# Patient Record
Sex: Female | Born: 1995 | Race: White | Hispanic: No | Smoking: Never smoker
Health system: Southern US, Community
[De-identification: ages and names within clinical notes are randomized; demographics above are authoritative.]

## PROBLEM LIST (undated history)

## (undated) VITALS — BP 136/91 | HR 107 | Temp 98.7°F | Resp 18 | Ht 62.0 in | Wt 283.0 lb

## (undated) DIAGNOSIS — R519 Headache, unspecified: Secondary | ICD-10-CM

## (undated) DIAGNOSIS — N809 Endometriosis, unspecified: Secondary | ICD-10-CM

## (undated) HISTORY — DX: Headache, unspecified: R51.9

## (undated) HISTORY — PX: OTHER SURGICAL HISTORY: SHX169

---

## 2019-05-20 ENCOUNTER — Encounter (HOSPITAL_COMMUNITY): Payer: Self-pay | Admitting: Emergency Medicine

## 2019-05-20 ENCOUNTER — Emergency Department (HOSPITAL_COMMUNITY): Payer: BC Managed Care – PPO

## 2019-05-20 ENCOUNTER — Other Ambulatory Visit: Payer: Self-pay

## 2019-05-20 ENCOUNTER — Emergency Department (HOSPITAL_COMMUNITY)
Admission: EM | Admit: 2019-05-20 | Discharge: 2019-05-20 | Disposition: A | Discharge status: Home / Self Care | Payer: BC Managed Care – PPO | Attending: Emergency Medicine | Admitting: Emergency Medicine

## 2019-05-20 DIAGNOSIS — R102 Pelvic and perineal pain: Secondary | ICD-10-CM | POA: Diagnosis present

## 2019-05-20 DIAGNOSIS — R52 Pain, unspecified: Secondary | ICD-10-CM

## 2019-05-20 HISTORY — DX: Endometriosis, unspecified: N80.9

## 2019-05-20 LAB — BASIC METABOLIC PANEL
Anion gap: 9 (ref 5–15)
BUN: 12 mg/dL (ref 6–20)
CO2: 21 mmol/L — ABNORMAL LOW (ref 22–32)
Calcium: 9.3 mg/dL (ref 8.9–10.3)
Chloride: 109 mmol/L (ref 98–111)
Creatinine, Ser: 0.58 mg/dL (ref 0.44–1.00)
GFR calc Af Amer: >60 mL/min (ref >60)
GFR calc non Af Amer: >60 mL/min (ref >60)
Glucose, Bld: 92 mg/dL (ref 70–99)
Potassium: 3.6 mmol/L (ref 3.5–5.1)
Sodium: 139 mmol/L (ref 135–145)

## 2019-05-20 LAB — URINALYSIS, ROUTINE W REFLEX MICROSCOPIC
Bilirubin Urine: NEGATIVE
Glucose, UA: NEGATIVE mg/dL
Hgb urine dipstick: NEGATIVE
Ketones, ur: NEGATIVE mg/dL
Nitrite: NEGATIVE
Protein, ur: NEGATIVE mg/dL
Specific Gravity, Urine: 1.018 (ref 1.005–1.030)
pH: 6 (ref 5.0–8.0)

## 2019-05-20 LAB — CBC WITH DIFFERENTIAL/PLATELET
Abs Immature Granulocytes: 0.04 10*3/uL (ref 0.00–0.07)
Basophils Absolute: 0 10*3/uL (ref 0.0–0.1)
Basophils Relative: 0 %
Eosinophils Absolute: 0.2 10*3/uL (ref 0.0–0.5)
Eosinophils Relative: 2 %
HCT: 39.5 % (ref 36.0–46.0)
Hemoglobin: 12.4 g/dL (ref 12.0–15.0)
Immature Granulocytes: 0 %
Lymphocytes Relative: 24 %
Lymphs Abs: 2.3 10*3/uL (ref 0.7–4.0)
MCH: 26.6 pg (ref 26.0–34.0)
MCHC: 31.4 g/dL (ref 30.0–36.0)
MCV: 84.6 fL (ref 80.0–100.0)
Monocytes Absolute: 0.6 10*3/uL (ref 0.1–1.0)
Monocytes Relative: 6 %
Neutro Abs: 6.6 10*3/uL (ref 1.7–7.7)
Neutrophils Relative %: 68 %
Platelets: 345 10*3/uL (ref 150–400)
RBC: 4.67 MIL/uL (ref 3.87–5.11)
RDW: 13.6 % (ref 11.5–15.5)
WBC: 9.8 10*3/uL (ref 4.0–10.5)
nRBC: 0 % (ref 0.0–0.2)

## 2019-05-20 LAB — POC URINE PREG, ED: Preg Test, Ur: NEGATIVE

## 2019-05-20 MED ORDER — CEPHALEXIN 500 MG PO CAPS: 500.0000 mg | ORAL_CAPSULE | Freq: Four times a day (QID) | ORAL | 0 refills | Status: DC

## 2019-05-20 MED ORDER — PHENAZOPYRIDINE HCL 200 MG PO TABS: 200.0000 mg | ORAL_TABLET | Freq: Three times a day (TID) | ORAL | 0 refills | Status: DC

## 2019-05-20 NOTE — Discharge Instructions (Signed)
Please take your medications, as prescribed.  Please follow-up with your OB/GYN/fertility provider in [place] for ongoing evaluation and management of your endometriosis and frequent ovarian cysts.   Return to the ED or seek medical attention for any new or worsening symptoms.

## 2019-05-20 NOTE — ED Notes (Signed)
Patient transported to Ultrasound 

## 2019-05-20 NOTE — ED Provider Notes (Signed)
[facility] EMERGENCY DEPARTMENT Provider Note   CSN: [id number] Arrival date & time: 05/20/19  1740     History Chief Complaint  Patient presents with  . Pelvic Pain    Tiffany Weber is a 23 y.o. female with PMH significant for endometriosis and ovarian cysts who presents to the ED with a 2-hour history of sharp, 8 out of 10 suprapubic and lower left abdominal pain.  She reports that this pain is worse with movement.  She is sexually active, but is adamant that she is not pregnant.  She denies any recent illness, fevers, chills, upper abdominal discomfort, nausea or vomiting, vaginal bleeding, vaginal discharge, constipation or hematochezia, dysuria, or increased urinary frequency.  She reports that she had a right-sided salpingo-oophorectomy resulting from her endometriosis.  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  HPI     Past Medical History:  Diagnosis Date  . Endometriosis     There are no problems to display for this patient.   Past Surgical History:  Procedure Laterality Date  . oophrectomy Right      OB History    Gravida      Para      Term      Preterm      AB      Living  0     SAB      TAB      Ectopic      Multiple      Live Births              History reviewed. No pertinent family history.  Social History   Tobacco Use  . Smoking status: Never Smoker  . Smokeless tobacco: Never Used  Substance Use Topics  . Alcohol use: Never  . Drug use: Never    Home Medications Prior to Admission medications   Medication Sig Start Date End Date Taking? Authorizing Provider  FLUoxetine (PROZAC) 40 MG capsule Take 40 mg by mouth every evening.   Yes [provider]  cephALEXin (KEFLEX) 500 MG capsule Take 1 capsule (500 mg total) by mouth 4 (four) times daily. 05/20/19   Lorelee New, PA-C  phenazopyridine (PYRIDIUM) 200 MG tablet Take 1 tablet (200 mg  total) by mouth 3 (three) times daily. 05/20/19   Lorelee New, PA-C    Allergies    Patient has no known allergies.  Review of Systems   Review of Systems  Constitutional: Negative for chills and fever.  Gastrointestinal: Positive for abdominal pain. Negative for constipation, diarrhea and nausea.  Genitourinary: Positive for pelvic pain. Negative for dysuria, flank pain, genital sores, hematuria, vaginal bleeding, vaginal discharge and vaginal pain.      Physical Exam Updated Vital Signs BP (!) 132/99 (BP Location: Left Arm)   Pulse 85   Temp 98.2 F (36.8 C) (Oral)   Resp 18   Ht 5\' 4"  (1.626 m)   Wt 123.4 kg   LMP 04/29/2019   SpO2 98%   BMI 46.69 kg/m   Physical Exam Vitals and nursing note reviewed. Exam conducted with a chaperone present.  Constitutional:      Appearance: Normal appearance. She is obese.  HENT:     Head: Normocephalic and atraumatic.  Eyes:     General: No scleral icterus.    Conjunctiva/sclera: Conjunctivae normal.  Cardiovascular:     Rate and Rhythm: Normal rate and regular rhythm.  Pulses: Normal pulses.     Heart sounds: Normal heart sounds.  Pulmonary:     Effort: Pulmonary effort is normal.     Breath sounds: Normal breath sounds.  Abdominal:     Comments: Soft, nondistended.  TTP in lower abdomen / pelvic region, particularly suprapubic and LLQ.  No guarding.  No overlying skin changes.  Musculoskeletal:     Cervical back: Normal range of motion and neck supple.  Skin:    General: Skin is dry.  Neurological:     Mental Status: She is alert and oriented to person, place, and time.     GCS: GCS eye subscore is 4. GCS verbal subscore is 5. GCS motor subscore is 6.  Psychiatric:        Mood and Affect: Mood normal.        Behavior: Behavior normal.        Thought Content: Thought content normal.     ED Results / Procedures / Treatments   Labs (all labs ordered are listed, but only abnormal results are displayed) Labs  Reviewed  URINALYSIS, ROUTINE W REFLEX MICROSCOPIC - Abnormal; Notable for the following components:      Result Value   APPearance CLOUDY (*)    Leukocytes,Ua SMALL (*)    Bacteria, UA RARE (*)    All other components within normal limits  BASIC METABOLIC PANEL - Abnormal; Notable for the following components:   CO2 21 (*)    All other components within normal limits  CBC WITH DIFFERENTIAL/PLATELET  POC URINE PREG, ED    EKG None  Radiology US PELVIC COMPLETE W TRANSVAGINAL AND TORSION R/O  Result Date: 05/20/2019 CLINICAL DATA:  Initial evaluation for acute left pelvic pain. History of prior right oophorectomy. EXAM: TRANSABDOMINAL AND TRANSVAGINAL ULTRASOUND OF PELVIS DOPPLER ULTRASOUND OF OVARIES TECHNIQUE: Both transabdominal and transvaginal ultrasound examinations of the pelvis were performed. Transabdominal technique was performed for global imaging of the pelvis including uterus, ovaries, adnexal regions, and pelvic cul-de-sac. It was necessary to proceed with endovaginal exam following the transabdominal exam to visualize the uterus, endometrium, and ovaries. Color and duplex Doppler ultrasound was utilized to evaluate blood flow to the ovaries. COMPARISON:  Prior ultrasound from 01/30/2018. FINDINGS: Uterus Measurements: 6.5 x 3.8 x 4.4 cm = volume: 56 mL. No fibroids or other mass visualized. Endometrium Thickness: 3.0 mm.  No focal abnormality visualized. Right ovary Reportedly surgically absent and not visualized.  No adnexal mass. Left ovary Measurements: 4.1 x 3.7 x 4.1 cm = volume: 33 mL. Normal appearance/no adnexal mass. Pulsed Doppler evaluation of the left ovary demonstrates normal low-resistance arterial and venous waveforms. Other findings No abnormal free fluid. IMPRESSION: 1. No acute abnormality within the pelvis. Normal left ovary with no evidence for torsion. 2. Prior right oophorectomy with nonvisualization of the right ovary. No right adnexal mass. Electronically  Signed   By: Jeannine Boga M.D.   On: 05/20/2019 20:55    Procedures Procedures (including critical care time)  Medications Ordered in ED Medications - No data to display  ED Course  I have reviewed the triage vital signs and the nursing notes.  Pertinent labs & imaging results that were available during my care of the patient were reviewed by me and considered in my medical decision making (see chart for details).    MDM Rules/Calculators/A&P                      Patient's history of sudden onset 8  out of 10 sharp pelvic pain in addition to her history of right-sided salpingo-oophorectomy and left-sided ovarian cysts elicits concern for ovarian torsion and ectopic pregnancy.  Patient is adamant that she is not pregnant, will obtain point-of-care urine pregnancy to r/o ectopic.  Will obtain ultrasound pelvis with Doppler to evaluate for torsion.  Patient denies any nausea or vomiting, which is somewhat reassuring.  Patient states that she prefers not to take pain medications.  Patient is hemodynamically stable without tachycardia or hypotension and I do not suspect any hemorrhage related to possibly ruptured ovarian cyst.  Abdominal exam is relatively benign.  Patient's last bowel movement was last night and she denies any pain with defecation, hematochezia, or melena.  Denies recent constipation or loose stools.    Patient denies any vaginal discharge.  Offered pelvic exam with wet prep to evaluate for BV, yeast infection, or trichomoniasis, but patient declined.  Given her discomfort, would prefer to perform bimanual exam and STI testing to assess for PID, but again patient declined.    Ultrasound demonstrates no evidence of torsion or other acute abnormalities of the pelvis.  CBC and BMP unremarkable, POC urine pregnancy is negative.  UA demonstrates cloudy urine with rare bacteria and small leukocytes.  Given her suprapubic tenderness on physical exam, will prescribe Pyridium and  Keflex x5 days .  On reexamination, patient is feeling improved and she is reassured by the comprehensive work-up.  All of the evaluation and work-up results were discussed with the patient and any family at bedside. They were provided opportunity to ask any additional questions and have none at this time. They have expressed understanding of verbal discharge instructions as well as return precautions and are agreeable to the plan.    Final Clinical Impression(s) / ED Diagnoses Final diagnoses:  Pelvic pain in female    Rx / DC Orders ED Discharge Orders         Ordered    cephALEXin (KEFLEX) 500 MG capsule  4 times daily     05/20/19 2107    phenazopyridine (PYRIDIUM) 200 MG tablet  3 times daily     05/20/19 2107           Elvera MariaGreen, Mulan Adan L, PA-C 05/20/19 2109    Eber HongMiller, Brian, MD 05/22/19 [phone]

## 2019-05-20 NOTE — ED Triage Notes (Signed)
Patient reports pelvic pain, states she thinks "she is having a cyst rupture." Has history of same.

## 2019-11-23 ENCOUNTER — Telehealth (HOSPITAL_COMMUNITY): Payer: Self-pay | Admitting: Psychiatry

## 2019-12-04 NOTE — Progress Notes (Deleted)
Psychiatric Initial Adult Assessment   Patient Identification: Tiffany Weber MRN:  [id number] Date of Evaluation:  12/04/2019 Referral Source: *** Chief Complaint:   Visit Diagnosis: No diagnosis found.  History of Present Illness:   Tiffany Weber is a 24 y.o. year old female with a history of depression, anxiety, endometriosis, who is referred for depression.  - per chart review, she was instructed to taper off fluoxetine from 20 mg daily, and start venlafaxine.        Associated Signs/Symptoms: Depression Symptoms:  {DEPRESSION SYMPTOMS:20000} (Hypo) Manic Symptoms:  {BHH MANIC SYMPTOMS:22872} Anxiety Symptoms:  {BHH ANXIETY SYMPTOMS:22873} Psychotic Symptoms:  {BHH PSYCHOTIC SYMPTOMS:22874} PTSD Symptoms: {BHH PTSD SYMPTOMS:22875}  Past Psychiatric History:  Outpatient:  Psychiatry admission:  Previous suicide attempt:  Past trials of medication:  History of violence:   Previous Psychotropic Medications: {YES/NO:21197}  Substance Abuse History in the last 12 months:  {yes no:314532}  Consequences of Substance Abuse: {BHH CONSEQUENCES OF SUBSTANCE ABUSE:22880}  Past Medical History:  Past Medical History:  Diagnosis Date  . Endometriosis     Past Surgical History:  Procedure Laterality Date  . oophrectomy Right     Family Psychiatric History: ***  Family History: No family history on file.  Social History:   Social History   Socioeconomic History  . Marital status: Married    Spouse name: Not on file  . Number of children: Not on file  . Years of education: Not on file  . Highest education level: Not on file  Occupational History  . Not on file  Tobacco Use  . Smoking status: Never Smoker  . Smokeless tobacco: Never Used  Vaping Use  . Vaping Use: Never used  Substance and Sexual Activity  . Alcohol use: Never  . Drug use: Never  . Sexual activity: Not on file  Other Topics Concern  . Not on file  Social History Narrative  . Not on file    Social Determinants of Health   Financial Resource Strain:   . Difficulty of Paying Living Expenses:   Food Insecurity:   . Worried About Programme researcher, broadcasting/film/video in the Last Year:   . Barista in the Last Year:   Transportation Needs:   . Freight forwarder (Medical):   Marland Kitchen Lack of Transportation (Non-Medical):   Physical Activity:   . Days of Exercise per Week:   . Minutes of Exercise per Session:   Stress:   . Feeling of Stress :   Social Connections:   . Frequency of Communication with Friends and Family:   . Frequency of Social Gatherings with Friends and Family:   . Attends Religious Services:   . Active Member of Clubs or Organizations:   . Attends Banker Meetings:   Marland Kitchen Marital Status:     Additional Social History: ***  Allergies:  No Known Allergies  Metabolic Disorder Labs: No results found for: HGBA1C, MPG No results found for: PROLACTIN No results found for: CHOL, TRIG, HDL, CHOLHDL, VLDL, LDLCALC No results found for: TSH  Therapeutic Level Labs: No results found for: LITHIUM No results found for: CBMZ No results found for: VALPROATE  Current Medications: Current Outpatient Medications  Medication Sig Dispense Refill  . cephALEXin (KEFLEX) 500 MG capsule Take 1 capsule (500 mg total) by mouth 4 (four) times daily. 20 capsule 0  . FLUoxetine (PROZAC) 40 MG capsule Take 40 mg by mouth every evening.    . phenazopyridine (PYRIDIUM) 200 MG tablet Take 1  tablet (200 mg total) by mouth 3 (three) times daily. 6 tablet 0   No current facility-administered medications for this visit.    Musculoskeletal: Strength & Muscle Tone: N/A Gait & Station: N/A Patient leans: N/A  Psychiatric Specialty Exam: Review of Systems  There were no vitals taken for this visit.There is no height or weight on file to calculate BMI.  General Appearance: {Appearance:22683}  Eye Contact:  {BHH EYE CONTACT:22684}  Speech:  Clear and Coherent  Volume:   Normal  Mood:  {BHH MOOD:22306}  Affect:  {Affect (PAA):22687}  Thought Process:  Coherent  Orientation:  Full (Time, Place, and Person)  Thought Content:  Logical  Suicidal Thoughts:  {ST/HT (PAA):22692}  Homicidal Thoughts:  {ST/HT (PAA):22692}  Memory:  Immediate;   Good  Judgement:  {Judgement (PAA):22694}  Insight:  {Insight (PAA):22695}  Psychomotor Activity:  Normal  Concentration:  Concentration: Good and Attention Span: Good  Recall:  Good  Fund of Knowledge:Good  Language: Good  Akathisia:  No  Handed:  Right  AIMS (if indicated):  not done  Assets:  Communication Skills Desire for Improvement  ADL's:  Intact  Cognition: WNL  Sleep:  {BHH GOOD/FAIR/POOR:22877}   Screenings:   Assessment and Plan:    Plan  The patient demonstrates the following risk factors for suicide: Chronic risk factors for suicide include: {Chronic Risk Factors for CBJSEGB:15176160}. Acute risk factors for suicide include: {Acute Risk Factors for VPXTGGY:69485462}. Protective factors for this patient include: {Protective Factors for Suicide VOJJ:00938182}. Considering these factors, the overall suicide risk at this point appears to be {Desc; low/moderate/high:110033}. Patient {ACTION; IS/IS XHB:71696789} appropriate for outpatient follow up.     Neysa Hotter, MD 7/16/20218:45 AM

## 2019-12-08 ENCOUNTER — Encounter: Payer: Self-pay | Admitting: *Deleted

## 2019-12-09 ENCOUNTER — Encounter: Payer: Self-pay | Admitting: Diagnostic Neuroimaging

## 2019-12-09 ENCOUNTER — Other Ambulatory Visit: Payer: Self-pay

## 2019-12-09 ENCOUNTER — Ambulatory Visit: Payer: Self-pay | Admitting: Diagnostic Neuroimaging

## 2019-12-09 VITALS — BP 118/68 | HR 76 | Ht 62.0 in | Wt 284.0 lb

## 2019-12-09 DIAGNOSIS — R519 Headache, unspecified: Secondary | ICD-10-CM

## 2019-12-09 DIAGNOSIS — G43109 Migraine with aura, not intractable, without status migrainosus: Secondary | ICD-10-CM

## 2019-12-09 DIAGNOSIS — H4710 Unspecified papilledema: Secondary | ICD-10-CM

## 2019-12-09 DIAGNOSIS — H538 Other visual disturbances: Secondary | ICD-10-CM

## 2019-12-09 DIAGNOSIS — Z8249 Family history of ischemic heart disease and other diseases of the circulatory system: Secondary | ICD-10-CM

## 2019-12-09 MED ORDER — NURTEC 75 MG PO TBDP: 75.0000 mg | ORAL_TABLET | Freq: Every day | ORAL | 6 refills | Status: DC | PRN

## 2019-12-09 MED ORDER — TOPIRAMATE 50 MG PO TABS: 50.0000 mg | ORAL_TABLET | Freq: Two times a day (BID) | ORAL | 12 refills | Status: DC

## 2019-12-09 MED ORDER — RIZATRIPTAN BENZOATE 10 MG PO TBDP: 10.0000 mg | ORAL_TABLET | ORAL | 11 refills | Status: DC | PRN

## 2019-12-09 NOTE — Progress Notes (Signed)
[facility]  PATIENT: Tiffany Weber DOB: 1995-06-15  REFERRING CLINICIAN: Conley Rolls, [place], OD HISTORY FROM: patient  REASON FOR VISIT: new consult    HISTORICAL  CHIEF COMPLAINT:  Chief Complaint  Patient presents with  . Headache    rm 6 New Pt :headaches started about 2 months ago; wake me during the night, Ibuprofen not helpful; eye dr said I had fluid behind my eyes"    HISTORY OF PRESENT ILLNESS:   24 year old female here for evaluation of papilledema.  May 2021 patient had onset of left periorbital sharp headaches, blurred vision, seeing halos and visual disturbance.  Sometimes she would see a film over her left eye.  Patient went to eye doctor for evaluation of possible new glasses.  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  Patient does have remote history of migraine headaches at age 82 years old with throbbing headaches, nausea, sensitive to light and sound.  She was on topiramate for a while in the past.   REVIEW OF SYSTEMS: Full 14 system review of systems performed and negative with exception of: As per HPI.   ALLERGIES: No Known Allergies  HOME MEDICATIONS: Outpatient Medications Prior to Visit  Medication Sig Dispense Refill  . FLUoxetine (PROZAC) 40 MG capsule Take 40 mg by mouth every evening.    . hydrOXYzine (ATARAX/VISTARIL) 25 MG tablet Take by mouth.    . cephALEXin (KEFLEX) 500 MG capsule Take 1 capsule (500 mg total) by mouth 4 (four) times daily. 20 capsule 0  . phenazopyridine (PYRIDIUM) 200 MG tablet Take 1 tablet (200 mg total) by mouth 3 (three) times daily. 6 tablet 0   No facility-administered medications prior to visit.    PAST MEDICAL HISTORY: Past Medical History:  Diagnosis Date  . Endometriosis   . Frequent headaches     PAST SURGICAL HISTORY: Past Surgical History:  Procedure Laterality Date  . oophrectomy Right     FAMILY HISTORY: Family History  Problem  Relation Age of Onset  . Hypertension Mother   . Hypertension Father   . Diabetes Father   . Diabetes Maternal Grandmother   . Stroke Maternal Grandmother   . Diabetes Maternal Grandfather   . Stroke Maternal Grandfather   . Diabetes Paternal Grandmother   . Stroke Paternal Grandmother   . Diabetes Paternal Grandfather   . Stroke Paternal Grandfather   . Aneurysm Other     SOCIAL HISTORY: Social History   Socioeconomic History  . Marital status: Divorced    Spouse name: Not on file  . Number of children: 0  . Years of education: Not on file  . 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 Smoking status: Never Smoker  . Smokeless tobacco: Never Used  Vaping Use  . Vaping Use: Never used  Substance and Sexual Activity  . Alcohol use: Never  . Drug use: Never  . Sexual activity: Not on file  Other Topics Concern  . Not on file  Social History Narrative   Caffeine- none   Social Determinants of Health   Financial Resource Strain:   . Difficulty of Paying Living Expenses:   Food Insecurity:   . Worried About Programme researcher, broadcasting/film/video in the Last Year:   . Barista in the Last Year:   Transportation Needs:   . Freight forwarder (Medical):   Marland Kitchen  Lack of Transportation (Non-Medical):   Physical Activity:   . Days of Exercise per Week:   . Minutes of Exercise per Session:   Stress:   . Feeling of Stress :   Social Connections:   . Frequency of Communication with Friends and Family:   . Frequency of Social Gatherings with Friends and Family:   . Attends Religious Services:   . Active Member of Clubs or Organizations:   . Attends Banker Meetings:   Marland Kitchen Marital Status:   Intimate Partner Violence:   . Fear of Current or Ex-Partner:   . Emotionally Abused:   Marland Kitchen Physically Abused:   . Sexually Abused:      PHYSICAL EXAM  GENERAL EXAM/CONSTITUTIONAL: Vitals:  Vitals:    12/09/19 1057  BP: 118/68  Pulse: 76  Weight: 284 lb (128.8 kg)  Height: 5\' 2"  (1.575 m)     Body mass index is 51.94 kg/m. Wt Readings from Last 3 Encounters:  12/09/19 284 lb (128.8 kg)  05/20/19 272 lb (123.4 kg)     Patient is in no distress; well developed, nourished and groomed; neck is supple  CARDIOVASCULAR:  Examination of carotid arteries is normal; no carotid bruits  Regular rate and rhythm, no murmurs  Examination of peripheral vascular system by observation and palpation is normal  EYES:  Ophthalmoscopic exam of optic discs and posterior segments is notable for MILD PAPILLEDEMA  No exam data present  MUSCULOSKELETAL:  Gait, strength, tone, movements noted in Neurologic exam below  NEUROLOGIC: MENTAL STATUS:  No flowsheet data found.  awake, alert, oriented to person, place and time  recent and remote memory intact  normal attention and concentration  language fluent, comprehension intact, naming intact  fund of knowledge appropriate  CRANIAL NERVE:   2nd - MILD PAPILLEDEMA  2nd, 3rd, 4th, 6th - pupils equal and reactive to light, visual fields full to confrontation, extraocular muscles intact, no nystagmus  5th - facial sensation symmetric  7th - facial strength symmetric  8th - hearing intact  9th - palate elevates symmetrically, uvula midline  11th - shoulder shrug symmetric  12th - tongue protrusion midline  MOTOR:   normal bulk and tone, full strength in the BUE, BLE  SENSORY:   normal and symmetric to light touch, temperature, vibration  COORDINATION:   finger-nose-finger, fine finger movements normal  REFLEXES:   deep tendon reflexes present and symmetric  GAIT/STATION:   narrow based gait     DIAGNOSTIC DATA (LABS, IMAGING, TESTING) - I reviewed patient records, labs, notes, testing and imaging myself where available.  Lab Results  Component Value Date   WBC 9.8 05/20/2019   HGB 12.4 05/20/2019    HCT 39.5 05/20/2019   MCV 84.6 05/20/2019   PLT 345 05/20/2019      Component Value Date/Time   NA 139 05/20/2019 1944   K 3.6 05/20/2019 1944   CL 109 05/20/2019 1944   CO2 21 (L) 05/20/2019 1944   GLUCOSE 92 05/20/2019 1944   BUN 12 05/20/2019 1944   CREATININE 0.58 05/20/2019 1944   CALCIUM 9.3 05/20/2019 1944   GFRNONAA >60 05/20/2019 1944   GFRAA >60 05/20/2019 1944   No results found for: CHOL, HDL, LDLCALC, LDLDIRECT, TRIG, CHOLHDL No results found for: 05/22/2019 No results found for: VITAMINB12 No results found for: TSH     ASSESSMENT AND PLAN  24 y.o. year old female here with papilledema, headaches, vision changes, starting around May 2021.  Could represent idiopathic intracranial  hypertension.  We will proceed with further work-up.  Also has history of migraine headaches and will start treatments for these as well.  Dx:  1. Papilledema   2. Blurred vision   3. Migraine with aura and without status migrainosus, not intractable   4. Family history of cerebral aneurysm   5. Nonintractable headache, unspecified chronicity pattern, unspecified headache type     PLAN:  Papilledema / headaches (eval for idiopathic intracranial hypertension (pseudotumor cerebri)) - MRI brain, MRA head - then consider LP - then consider topiramate vs acetazolamide  MIGRAINE PREVENTION  LIFESTYLE CHANGES -Stop or avoid smoking -Decrease or avoid caffeine / alcohol -Eat and sleep on a regular schedule -Exercise several times per week - start topiramate 50mg  at bedtime; after 1-2 weeks increase to 50mg  twice a day; drink plenty of water Consider 2nd line - fremanezumab (Ajovy) 225mg  monthly (or 675mg  every 3 months) - galazanezumab (Emgality) 240mg  loading dose; then 120mg  monthly  MIGRAINE RESCUE  - ibuprofen, tylenol as needed - rizatriptan (Maxalt) 10mg  as needed for breakthrough headache; may repeat x 1 after 2 hours; max 2 tabs per day or 8 per month - rimegepant (Nurtec)  75mg  as needed for breakthrough headache; max 8 per month   Orders Placed This Encounter  Procedures  . MR BRAIN W WO CONTRAST  . MR ANGIO HEAD WO CONTRAST   Meds ordered this encounter  Medications  . topiramate (TOPAMAX) 50 MG tablet    Sig: Take 1 tablet (50 mg total) by mouth 2 (two) times daily.    Dispense:  60 tablet    Refill:  12  . rizatriptan (MAXALT-MLT) 10 MG disintegrating tablet    Sig: Take 1 tablet (10 mg total) by mouth as needed for migraine. May repeat in 2 hours if needed    Dispense:  9 tablet    Refill:  11  . Rimegepant Sulfate (NURTEC) 75 MG TBDP    Sig: Take 75 mg by mouth daily as needed.    Dispense:  8 tablet    Refill:  6   Return in about 6 months (around 06/10/2020).    , MD 12/09/2019, 11:21 AM Certified in Neurology, Neurophysiology and Neuroimaging  [facility] [address] [place],  [phone] a

## 2019-12-09 NOTE — Patient Instructions (Signed)
Papilledema / headaches - MRI brain, MRA head - then consider LP - then consider topiramate vs acetazolamide  MIGRAINE PREVENTION  LIFESTYLE CHANGES -Stop or avoid smoking -Decrease or avoid caffeine / alcohol -Eat and sleep on a regular schedule -Exercise several times per week - start topiramate 50mg  at bedtime; after 1-2 weeks increase to 50mg  twice a day; drink plenty of water  MIGRAINE RESCUE  - ibuprofen, tylenol as needed - rizatriptan (Maxalt) 10mg  as needed for breakthrough headache; may repeat x 1 after 2 hours; max 2 tabs per day or 8 per month - rimegepant (Nurtec) 75mg  as needed for breakthrough headache; max 8 per month

## 2019-12-10 ENCOUNTER — Telehealth (HOSPITAL_COMMUNITY): Payer: Self-pay | Admitting: Psychiatry

## 2019-12-10 ENCOUNTER — Other Ambulatory Visit: Payer: Self-pay

## 2019-12-10 NOTE — Telephone Encounter (Signed)
Sent link for video visit through Epic. Patient did not sign in. Called the patient  for appointment scheduled today. The patient did not answer the phone. Left voice message to contact the office.  

## 2019-12-12 ENCOUNTER — Telehealth: Payer: Self-pay | Admitting: Diagnostic Neuroimaging

## 2019-12-12 NOTE — Telephone Encounter (Signed)
self pay order sent to GI. They will reach out to the patient to schedule.  °

## 2019-12-16 ENCOUNTER — Encounter: Payer: Self-pay | Admitting: Emergency Medicine

## 2019-12-16 ENCOUNTER — Ambulatory Visit
Admission: EM | Admit: 2019-12-16 | Discharge: 2019-12-16 | Disposition: A | Discharge status: Home / Self Care | Payer: Medicaid Other | Attending: Emergency Medicine | Admitting: Emergency Medicine

## 2019-12-16 ENCOUNTER — Ambulatory Visit (INDEPENDENT_AMBULATORY_CARE_PROVIDER_SITE_OTHER)
Admission: RE | Admit: 2019-12-16 | Discharge: 2019-12-16 | Disposition: A | Discharge status: Home / Self Care | Payer: Medicaid Other | Source: Ambulatory Visit

## 2019-12-16 DIAGNOSIS — J029 Acute pharyngitis, unspecified: Secondary | ICD-10-CM

## 2019-12-16 DIAGNOSIS — R0602 Shortness of breath: Secondary | ICD-10-CM

## 2019-12-16 DIAGNOSIS — Z20822 Contact with and (suspected) exposure to covid-19: Secondary | ICD-10-CM | POA: Insufficient documentation

## 2019-12-16 DIAGNOSIS — R5383 Other fatigue: Secondary | ICD-10-CM

## 2019-12-16 DIAGNOSIS — R05 Cough: Secondary | ICD-10-CM

## 2019-12-16 DIAGNOSIS — R059 Cough, unspecified: Secondary | ICD-10-CM

## 2019-12-16 DIAGNOSIS — J069 Acute upper respiratory infection, unspecified: Secondary | ICD-10-CM | POA: Insufficient documentation

## 2019-12-16 LAB — POCT RAPID STREP A (OFFICE): Rapid Strep A Screen: NEGATIVE

## 2019-12-16 MED ORDER — MELOXICAM 15 MG PO TABS: 15.0000 mg | ORAL_TABLET | Freq: Every day | ORAL | 0 refills | Status: DC

## 2019-12-16 NOTE — ED Provider Notes (Signed)
[facility]  Virtual Visit via Video Note:  Tiffany Weber  initiated request for Telemedicine visit with [facility] team. I connected with Lizbeth Bark  on 12/16/2019 at 4:58 PM  for a synchronized telemedicine visit using a video enabled HIPPA compliant telemedicine application. I verified that I am speaking with [facility]  using two identifiers. Marykay Lex, NP  was physically located in a [facility] Urgent care site and [facility] was located at a different location.   The limitations of evaluation and management by telemedicine as well as the availability of in-person appointments were discussed. Patient was informed that she  may incur a bill ( including co-pay) for this virtual visit encounter. Aluel Petti  expressed understanding and gave verbal consent to proceed with virtual visit.   [id number] 12/16/19 Arrival Time: 1623  CC: VV  SUBJECTIVE: History from: patient.  Tiffany Weber is a 24 y.o. female who presents with abrupt onset of sore throat for the last 2 days. Reports that she is also experiencing SOB, dry cough, chest burning. Denies having any of these symptoms in the past. No hx Covid. Has not had Covid vaccines. Inquiring about strep test. Has not taken OTC medication for this. Symptoms are made worse with swallowing, but tolerating liquids and own secretions without difficulty.     Denies fever, chills, ear pain, sinus pain, rhinorrhea, nasal congestion, wheezing, nausea, rash, changes in bowel or bladder habits.    ROS: As per HPI.  All other pertinent ROS negative.     Past Medical History:  Diagnosis Date  . Endometriosis   . Frequent headaches    Past Surgical History:  Procedure Laterality Date  . oophrectomy Right    No Known Allergies No current facility-administered medications on file prior to encounter.   Current Outpatient Medications on File Prior to Encounter  Medication Sig Dispense Refill  . FLUoxetine  (PROZAC) 40 MG capsule Take 40 mg by mouth every evening.    . hydrOXYzine (ATARAX/VISTARIL) 25 MG tablet Take by mouth.    . Rimegepant Sulfate (NURTEC) 75 MG TBDP Take 75 mg by mouth daily as needed. 8 tablet 6  . rizatriptan (MAXALT-MLT) 10 MG disintegrating tablet Take 1 tablet (10 mg total) by mouth as needed for migraine. May repeat in 2 hours if needed 9 tablet 11  . topiramate (TOPAMAX) 50 MG tablet Take 1 tablet (50 mg total) by mouth 2 (two) times daily. 60 tablet 12   Social History   Socioeconomic History  . Marital status: Divorced    Spouse name: Not on file  . Number of children: 0  . Years of education: Not on file  . 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 Smoking status: Never Smoker  . Smokeless tobacco: Never Used  Vaping Use  . Vaping Use: Never used  Substance and Sexual Activity  . Alcohol use: Never  . Drug use: Never  . Sexual activity: Not on file  Other Topics Concern  . Not on file  Social History Narrative   Caffeine- none   Social Determinants of Health   Financial Resource Strain:   . Difficulty of Paying Living Expenses:   Food Insecurity:   . Worried About Programme researcher, broadcasting/film/video in the Last Year:   . Barista in the Last Year:   Transportation Needs:   . Freight forwarder (Medical):   Marland Kitchen  Lack of Transportation (Non-Medical):   Physical Activity:   . Days of Exercise per Week:   . Minutes of Exercise per Session:   Stress:   . Feeling of Stress :   Social Connections:   . Frequency of Communication with Friends and Family:   . Frequency of Social Gatherings with Friends and Family:   . Attends Religious Services:   . Active Member of Clubs or Organizations:   . Attends Banker Meetings:   Marland Kitchen Marital Status:   Intimate Partner Violence:   . Fear of Current or Ex-Partner:   . Emotionally Abused:   Marland Kitchen Physically Abused:   . Sexually  Abused:    Family History  Problem Relation Age of Onset  . Hypertension Mother   . Hypertension Father   . Diabetes Father   . Diabetes Maternal Grandmother   . Stroke Maternal Grandmother   . Diabetes Maternal Grandfather   . Stroke Maternal Grandfather   . Diabetes Paternal Grandmother   . Stroke Paternal Grandmother   . Diabetes Paternal Grandfather   . Stroke Paternal Grandfather   . Aneurysm Other     OBJECTIVE:   There were no vitals filed for this visit.  General appearance: alert; no distress Eyes: EOMI grossly HENT: normocephalic; atraumatic Neck: supple with FROM Lungs: normal respiratory effort; speaking in full sentences without difficulty Extremities: moves extremities without difficulty Skin: No obvious rashes Neurologic: No facial asymmetries Psychological: alert and cooperative; normal mood and affect  ASSESSMENT & PLAN:  1. Cough   2. SOB (shortness of breath)   3. Sore throat   4. Other fatigue     No orders of the defined types were placed in this encounter.   Discussed with patient that with chest burning and SOB, that it would be best to be evaluated in person She agrees to come into the office today States that she will go to the [facility] office for further evaluation, Covid and strep testing if needed Take Zyrtec D. Use daily for symptomatic relief Drink warm or cool liquids, use throat lozenges, or popsicles to help alleviate symptoms Take OTC ibuprofen or tylenol as needed for pain Follow up with PCP if symptoms persists Return or go to ER if patient has any new or worsening symptoms such as fever, chills, nausea, vomiting, worsening sore throat, cough, abdominal pain, chest pain, changes in bowel or bladder habits, etc...  I discussed the assessment and treatment plan with the patient. The patient was provided an opportunity to ask questions and all were answered. The patient agreed with the plan and demonstrated an understanding of  the instructions.   The patient was advised to call back or seek an in-person evaluation if the symptoms worsen or if the condition fails to improve as anticipated.  I provided 10 minutes of non-face-to-face time during this encounter.  Marykay Lex, NP  12/16/2019 4:58 PM         Moshe Cipro, NP 12/16/19 1658

## 2019-12-16 NOTE — ED Triage Notes (Signed)
Sore throat, congestion cough x 3 days

## 2019-12-16 NOTE — Discharge Instructions (Addendum)
Come into the office to be further evaluated in person as well as for Covid testing

## 2019-12-16 NOTE — Discharge Instructions (Signed)
Strep negative. Culture sent.  We will follow up with you regarding abnormal results COVID testing ordered.  It will take between 2-5 days for test results.  Someone will contact you regarding abnormal results.    In the meantime: You should remain isolated in your home for 10 days from symptom onset AND greater than 72 hours after symptoms resolution (absence of fever without the use of fever-reducing medication and improvement in respiratory symptoms), whichever is longer Get plenty of rest and push fluids Mobic for sore throat Use OTC zyrtec for nasal congestion, runny nose, and/or sore throat Use OTC flonase for nasal congestion and runny nose Use medications daily for symptom relief Use OTC medications like ibuprofen or tylenol as needed fever or pain Call or go to the ED if you have any new or worsening symptoms such as fever, worsening cough, shortness of breath, chest tightness, chest pain, turning blue, changes in mental status, etc..Marland Kitchen

## 2019-12-16 NOTE — ED Provider Notes (Signed)
[facility]   [id number] 12/16/19 Arrival Time: 1716   CC: COVID symptoms  SUBJECTIVE: History from: patient.  Kaicee Scarpino is a 24 y.o. female who presents with congestion, sore throat, fatigue, and difficulty catching breath x 3 days.  Denies sick exposure to COVID, flu or strep.  Has NOT tried OTC medications.  Symptoms are made worse with swallowing, but tolerating liquids and secretions.  Denies previous COVID infection.   Denies fever, chills, fatigue, sinus pain, rhinorrhea, cough, SOB, wheezing, chest pain, nausea, vomiting, changes in bowel or bladder habits.    ROS: As per HPI.  All other pertinent ROS negative.     Past Medical History:  Diagnosis Date  . Endometriosis   . Frequent headaches    Past Surgical History:  Procedure Laterality Date  . oophrectomy Right    No Known Allergies No current facility-administered medications on file prior to encounter.   Current Outpatient Medications on File Prior to Encounter  Medication Sig Dispense Refill  . FLUoxetine (PROZAC) 40 MG capsule Take 40 mg by mouth every evening.    . hydrOXYzine (ATARAX/VISTARIL) 25 MG tablet Take by mouth.    . Rimegepant Sulfate (NURTEC) 75 MG TBDP Take 75 mg by mouth daily as needed. 8 tablet 6  . rizatriptan (MAXALT-MLT) 10 MG disintegrating tablet Take 1 tablet (10 mg total) by mouth as needed for migraine. May repeat in 2 hours if needed 9 tablet 11  . topiramate (TOPAMAX) 50 MG tablet Take 1 tablet (50 mg total) by mouth 2 (two) times daily. 60 tablet 12   Social History   Socioeconomic History  . Marital status: Divorced    Spouse name: Not on file  . Number of children: 0  . Years of education: Not on file  . 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 Smoking status: Never Smoker  . Smokeless tobacco: Never Used  Vaping Use  . Vaping Use: Never used  Substance and Sexual Activity  .  Alcohol use: Never  . Drug use: Never  . Sexual activity: Not on file  Other Topics Concern  . Not on file  Social History Narrative   Caffeine- none   Social Determinants of Health   Financial Resource Strain:   . Difficulty of Paying Living Expenses:   Food Insecurity:   . Worried About Programme researcher, broadcasting/film/video in the Last Year:   . Barista in the Last Year:   Transportation Needs:   . Freight forwarder (Medical):   Marland Kitchen Lack of Transportation (Non-Medical):   Physical Activity:   . Days of Exercise per Week:   . Minutes of Exercise per Session:   Stress:   . Feeling of Stress :   Social Connections:   . Frequency of Communication with Friends and Family:   . Frequency of Social Gatherings with Friends and Family:   . Attends Religious Services:   . Active Member of Clubs or Organizations:   . Attends Banker Meetings:   Marland Kitchen Marital Status:   Intimate Partner Violence:   . Fear of Current or Ex-Partner:   . Emotionally Abused:   Marland Kitchen Physically Abused:   . Sexually Abused:    Family History  Problem Relation Age of Onset  . Hypertension Mother   . Hypertension Father   . Diabetes Father   . Diabetes Maternal Grandmother   . Stroke  Maternal Grandmother   . Diabetes Maternal Grandfather   . Stroke Maternal Grandfather   . Diabetes Paternal Grandmother   . Stroke Paternal Grandmother   . Diabetes Paternal Grandfather   . Stroke Paternal Grandfather   . Aneurysm Other     OBJECTIVE:  129/84 mmHg 87 bpm 97% O2 sat  99.2 F  General appearance: alert; appears mildly fatigued, but nontoxic; speaking in full sentences and tolerating own secretions HEENT: NCAT; Ears: EACs clear, TMs pearly gray; Eyes: PERRL.  EOM grossly intact. Nose: nares patent without rhinorrhea, Throat: oropharynx clear, tonsils non erythematous or enlarged, uvula midline  Neck: supple without LAD Lungs: unlabored respirations, symmetrical air entry; cough: absent; no  respiratory distress; CTAB Heart: regular rate and rhythm.   Skin: warm and dry Psychological: alert and cooperative; normal mood and affect  LABS:  Results for orders placed or performed during the hospital encounter of 12/16/19 (from the past 24 hour(s))  POCT rapid strep A     Status: None   Collection Time: 12/16/19  6:11 PM  Result Value Ref Range   Rapid Strep A Screen Negative Negative     ASSESSMENT & PLAN:  1. Sore throat   2. Viral URI   3. Suspected COVID-19 virus infection     Meds ordered this encounter  Medications  . meloxicam (MOBIC) 15 MG tablet    Sig: Take 1 tablet (15 mg total) by mouth daily.    Dispense:  30 tablet    Refill:  0    Order Specific Question:   Supervising Provider    Answer:   Eustace Moore [[id number]]     Strep negative. Culture sent.  We will follow up with you regarding abnormal results COVID testing ordered.  It will take between 2-5 days for test results.  Someone will contact you regarding abnormal results.    In the meantime: You should remain isolated in your home for 10 days from symptom onset AND greater than 72 hours after symptoms resolution (absence of fever without the use of fever-reducing medication and improvement in respiratory symptoms), whichever is longer Get plenty of rest and push fluids Mobic for sore throat Use OTC zyrtec for nasal congestion, runny nose, and/or sore throat Use OTC flonase for nasal congestion and runny nose Use medications daily for symptom relief Use OTC medications like ibuprofen or tylenol as needed fever or pain Call or go to the ED if you have any new or worsening symptoms such as fever, worsening cough, shortness of breath, chest tightness, chest pain, turning blue, changes in mental status, etc...    Reviewed expectations re: course of current medical issues. Questions answered. Outlined signs and symptoms indicating need for more acute intervention. Patient verbalized  understanding. After Visit Summary given.         Rennis Harding, PA-C 12/16/19 1836

## 2019-12-17 LAB — NOVEL CORONAVIRUS, NAA: SARS-CoV-2, NAA: NOT DETECTED

## 2019-12-17 LAB — CULTURE, GROUP A STREP (THRC)

## 2019-12-17 LAB — SARS-COV-2, NAA 2 DAY TAT

## 2019-12-18 ENCOUNTER — Telehealth (HOSPITAL_COMMUNITY): Payer: Self-pay

## 2019-12-18 MED ORDER — PENICILLIN V POTASSIUM 500 MG PO TABS: 500.0000 mg | ORAL_TABLET | Freq: Two times a day (BID) | ORAL | 0 refills | Status: DC

## 2019-12-25 ENCOUNTER — Emergency Department (HOSPITAL_COMMUNITY)
Admission: EM | Admit: 2019-12-25 | Discharge: 2019-12-26 | Disposition: A | Discharge status: Home / Self Care | Payer: Medicaid Other | Attending: Emergency Medicine | Admitting: Emergency Medicine

## 2019-12-25 ENCOUNTER — Inpatient Hospital Stay (HOSPITAL_COMMUNITY)
Admission: AD | Admit: 2019-12-25 | Discharge: 2019-12-27 | DRG: 885 | Disposition: A | Discharge status: Home / Self Care | Payer: Federal, State, Local not specified - Other | Attending: Psychiatry | Admitting: Psychiatry

## 2019-12-25 ENCOUNTER — Other Ambulatory Visit: Payer: Self-pay

## 2019-12-25 ENCOUNTER — Encounter (HOSPITAL_COMMUNITY): Payer: Self-pay

## 2019-12-25 DIAGNOSIS — J020 Streptococcal pharyngitis: Secondary | ICD-10-CM | POA: Diagnosis present

## 2019-12-25 DIAGNOSIS — Z791 Long term (current) use of non-steroidal anti-inflammatories (NSAID): Secondary | ICD-10-CM | POA: Diagnosis not present

## 2019-12-25 DIAGNOSIS — G4700 Insomnia, unspecified: Secondary | ICD-10-CM | POA: Diagnosis present

## 2019-12-25 DIAGNOSIS — Z560 Unemployment, unspecified: Secondary | ICD-10-CM

## 2019-12-25 DIAGNOSIS — R1110 Vomiting, unspecified: Secondary | ICD-10-CM | POA: Insufficient documentation

## 2019-12-25 DIAGNOSIS — Z5321 Procedure and treatment not carried out due to patient leaving prior to being seen by health care provider: Secondary | ICD-10-CM | POA: Insufficient documentation

## 2019-12-25 DIAGNOSIS — Z20822 Contact with and (suspected) exposure to covid-19: Secondary | ICD-10-CM | POA: Diagnosis present

## 2019-12-25 DIAGNOSIS — G43909 Migraine, unspecified, not intractable, without status migrainosus: Secondary | ICD-10-CM | POA: Diagnosis present

## 2019-12-25 DIAGNOSIS — E669 Obesity, unspecified: Secondary | ICD-10-CM | POA: Diagnosis present

## 2019-12-25 DIAGNOSIS — Z79899 Other long term (current) drug therapy: Secondary | ICD-10-CM | POA: Diagnosis not present

## 2019-12-25 DIAGNOSIS — F411 Generalized anxiety disorder: Secondary | ICD-10-CM | POA: Diagnosis present

## 2019-12-25 DIAGNOSIS — Z915 Personal history of self-harm: Secondary | ICD-10-CM

## 2019-12-25 DIAGNOSIS — R45851 Suicidal ideations: Secondary | ICD-10-CM | POA: Diagnosis present

## 2019-12-25 DIAGNOSIS — R197 Diarrhea, unspecified: Secondary | ICD-10-CM | POA: Insufficient documentation

## 2019-12-25 DIAGNOSIS — Z90721 Acquired absence of ovaries, unilateral: Secondary | ICD-10-CM | POA: Diagnosis not present

## 2019-12-25 DIAGNOSIS — Z8249 Family history of ischemic heart disease and other diseases of the circulatory system: Secondary | ICD-10-CM | POA: Diagnosis not present

## 2019-12-25 DIAGNOSIS — Z833 Family history of diabetes mellitus: Secondary | ICD-10-CM | POA: Diagnosis not present

## 2019-12-25 DIAGNOSIS — F332 Major depressive disorder, recurrent severe without psychotic features: Principal | ICD-10-CM | POA: Diagnosis present

## 2019-12-25 DIAGNOSIS — R000 Tachycardia, unspecified: Secondary | ICD-10-CM | POA: Diagnosis present

## 2019-12-25 DIAGNOSIS — Z823 Family history of stroke: Secondary | ICD-10-CM | POA: Diagnosis not present

## 2019-12-25 DIAGNOSIS — Z6841 Body Mass Index (BMI) 40.0 and over, adult: Secondary | ICD-10-CM | POA: Diagnosis not present

## 2019-12-25 DIAGNOSIS — R112 Nausea with vomiting, unspecified: Secondary | ICD-10-CM | POA: Diagnosis present

## 2019-12-25 LAB — COMPREHENSIVE METABOLIC PANEL
ALT: 25 U/L (ref 0–44)
ALT: 24 U/L (ref 0–44)
AST: 21 U/L (ref 15–41)
AST: 19 U/L (ref 15–41)
Albumin: 4.2 g/dL (ref 3.5–5.0)
Albumin: 4.3 g/dL (ref 3.5–5.0)
Alkaline Phosphatase: 100 U/L (ref 38–126)
Alkaline Phosphatase: 100 U/L (ref 38–126)
Anion gap: 10 (ref 5–15)
Anion gap: 10 (ref 5–15)
BUN: 14 mg/dL (ref 6–20)
BUN: 11 mg/dL (ref 6–20)
CO2: 23 mmol/L (ref 22–32)
CO2: 22 mmol/L (ref 22–32)
Calcium: 9.6 mg/dL (ref 8.9–10.3)
Calcium: 9.5 mg/dL (ref 8.9–10.3)
Chloride: 105 mmol/L (ref 98–111)
Chloride: 108 mmol/L (ref 98–111)
Creatinine, Ser: 0.67 mg/dL (ref 0.44–1.00)
Creatinine, Ser: 0.63 mg/dL (ref 0.44–1.00)
GFR calc Af Amer: >60 mL/min (ref >60)
GFR calc Af Amer: >60 mL/min (ref >60)
GFR calc non Af Amer: >60 mL/min (ref >60)
GFR calc non Af Amer: >60 mL/min (ref >60)
Glucose, Bld: 92 mg/dL (ref 70–99)
Glucose, Bld: 98 mg/dL (ref 70–99)
Potassium: 3.4 mmol/L — ABNORMAL LOW (ref 3.5–5.1)
Potassium: 3.7 mmol/L (ref 3.5–5.1)
Sodium: 138 mmol/L (ref 135–145)
Sodium: 140 mmol/L (ref 135–145)
Total Bilirubin: 0.4 mg/dL (ref 0.3–1.2)
Total Bilirubin: 0.6 mg/dL (ref 0.3–1.2)
Total Protein: 8.4 g/dL — ABNORMAL HIGH (ref 6.5–8.1)
Total Protein: 8.7 g/dL — ABNORMAL HIGH (ref 6.5–8.1)

## 2019-12-25 LAB — SARS CORONAVIRUS 2 BY RT PCR (HOSPITAL ORDER, PERFORMED IN ~~LOC~~ HOSPITAL LAB): SARS Coronavirus 2: NEGATIVE

## 2019-12-25 LAB — TSH: TSH: 3.601 u[IU]/mL (ref 0.350–4.500)

## 2019-12-25 LAB — CBC
HCT: 41.4 % (ref 36.0–46.0)
HCT: 40.8 % (ref 36.0–46.0)
Hemoglobin: 13.3 g/dL (ref 12.0–15.0)
Hemoglobin: 13.3 g/dL (ref 12.0–15.0)
MCH: 27.1 pg (ref 26.0–34.0)
MCH: 27.1 pg (ref 26.0–34.0)
MCHC: 32.1 g/dL (ref 30.0–36.0)
MCHC: 32.6 g/dL (ref 30.0–36.0)
MCV: 84.5 fL (ref 80.0–100.0)
MCV: 83.3 fL (ref 80.0–100.0)
Platelets: 362 10*3/uL (ref 150–400)
Platelets: 344 10*3/uL (ref 150–400)
RBC: 4.9 MIL/uL (ref 3.87–5.11)
RBC: 4.9 MIL/uL (ref 3.87–5.11)
RDW: 13.3 % (ref 11.5–15.5)
RDW: 13.2 % (ref 11.5–15.5)
WBC: 12.6 10*3/uL — ABNORMAL HIGH (ref 4.0–10.5)
WBC: 14.3 10*3/uL — ABNORMAL HIGH (ref 4.0–10.5)
nRBC: 0 % (ref 0.0–0.2)
nRBC: 0 % (ref 0.0–0.2)

## 2019-12-25 LAB — RAPID URINE DRUG SCREEN, HOSP PERFORMED
Amphetamines: NOT DETECTED
Barbiturates: NOT DETECTED
Benzodiazepines: NOT DETECTED
Cocaine: NOT DETECTED
Opiates: NOT DETECTED
Tetrahydrocannabinol: NOT DETECTED

## 2019-12-25 LAB — PREGNANCY, URINE: Preg Test, Ur: NEGATIVE

## 2019-12-25 LAB — HEMOGLOBIN A1C
Hgb A1c MFr Bld: 4.9 % (ref 4.8–5.6)
Mean Plasma Glucose: 93.93 mg/dL

## 2019-12-25 LAB — LIPID PANEL
Cholesterol: 185 mg/dL (ref 0–200)
HDL: 43 mg/dL (ref >40)
LDL Cholesterol: 109 mg/dL — ABNORMAL HIGH (ref 0–99)
Total CHOL/HDL Ratio: 4.3 RATIO
Triglycerides: 163 mg/dL — ABNORMAL HIGH (ref <150)
VLDL: 33 mg/dL (ref 0–40)

## 2019-12-25 LAB — I-STAT BETA HCG BLOOD, ED (MC, WL, AP ONLY): I-stat hCG, quantitative: <5 m[IU]/mL (ref <5)

## 2019-12-25 LAB — LIPASE, BLOOD: Lipase: 20 U/L (ref 11–51)

## 2019-12-25 MED ORDER — ONDANSETRON 4 MG PO TBDP
4.0000 mg | ORAL_TABLET | Freq: Once | ORAL | Status: AC | PRN
  Administered 2019-12-25: 4 mg via ORAL
  Filled 2019-12-25: qty 1

## 2019-12-25 MED ORDER — FLUOXETINE HCL 20 MG PO CAPS
40.0000 mg | ORAL_CAPSULE | Freq: Every day | ORAL | Status: DC
  Administered 2019-12-26 – 2019-12-27 (×2): 40 mg via ORAL
  Filled 2019-12-25 (×5): qty 2

## 2019-12-25 MED ORDER — ACETAMINOPHEN 325 MG PO TABS: 650.0000 mg | ORAL_TABLET | Freq: Four times a day (QID) | ORAL | Status: DC | PRN

## 2019-12-25 MED ORDER — ALUM & MAG HYDROXIDE-SIMETH 200-200-20 MG/5ML PO SUSP: 30.0000 mL | ORAL | Status: DC | PRN

## 2019-12-25 MED ORDER — BUSPIRONE HCL 5 MG PO TABS
5.0000 mg | ORAL_TABLET | Freq: Two times a day (BID) | ORAL | Status: DC
  Filled 2019-12-25 (×4): qty 1

## 2019-12-25 MED ORDER — LORAZEPAM 0.5 MG PO TABS: 0.5000 mg | ORAL_TABLET | Freq: Four times a day (QID) | ORAL | Status: DC | PRN

## 2019-12-25 MED ORDER — SODIUM CHLORIDE 0.9% FLUSH: 3.0000 mL | Freq: Once | INTRAVENOUS | Status: DC

## 2019-12-25 MED ORDER — MAGNESIUM HYDROXIDE 400 MG/5ML PO SUSP: 30.0000 mL | Freq: Every day | ORAL | Status: DC | PRN

## 2019-12-25 MED ORDER — PENICILLIN V POTASSIUM 250 MG PO TABS
500.0000 mg | ORAL_TABLET | Freq: Two times a day (BID) | ORAL | Status: DC
  Administered 2019-12-26: 500 mg via ORAL
  Filled 2019-12-25 (×2): qty 2
  Filled 2019-12-25 (×2): qty 1

## 2019-12-25 MED ORDER — HYDROXYZINE HCL 25 MG PO TABS: 25.0000 mg | ORAL_TABLET | Freq: Three times a day (TID) | ORAL | Status: DC | PRN
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
## 2019-12-25 NOTE — ED Triage Notes (Signed)
Patient c/o emesis and diarrhea that started today. patient denies any abdominal pain.
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
## 2019-12-25 NOTE — Progress Notes (Signed)
Recreation Therapy Notes  Date: 8.6.21 Time: 0930 Location: 300 Hall Dayroom  Group Topic: Stress Management  Goal Area(s) Addresses:  Patient will identify positive stress management techniques. Patient will identify benefits of using stress management post d/c.  Intervention: Stress Management  Activity: Guided Imagery.  LRT read a script that guided patients to envision their peaceful place.  Patients were to listen and follow along as script was read to engage in activity.    Education:  Stress Management, Discharge Planning.   Education Outcome: Acknowledges Education  Clinical Observations/Feedback: Pt did not attend group activity.    Tiffany Weber, LRT/CTRS         Tiffany Weber A 12/25/2019 12:02 PM 

## 2019-12-25 NOTE — Progress Notes (Signed)
[facility] NOVEL CORONAVIRUS (COVID-19) DAILY CHECK-OFF SYMPTOMS - answer yes or no to each - every day NO YES  Have you had a fever in the past 24 hours?  . Fever (Temp > 37.80C / 100F) X   Have you had any of these symptoms in the past 24 hours? . New Cough .  Sore Throat  .  Shortness of Breath .  Difficulty Breathing .  Unexplained Body Aches   X   Have you had any one of these symptoms in the past 24 hours not related to allergies?   . Runny Nose .  Nasal Congestion .  Sneezing   X   If you have had runny nose, nasal congestion, sneezing in the past 24 hours, has it worsened?  X   EXPOSURES - check yes or no X   Have you traveled outside the state in the past 14 days?  X   Have you been in contact with someone with a confirmed diagnosis of COVID-19 or PUI in the past 14 days without wearing appropriate PPE?  X   Have you been living in the same home as a person with confirmed diagnosis of COVID-19 or a PUI (household contact)?    X   Have you been diagnosed with COVID-19?    X              What to do next: Answered NO to all: Answered YES to anything:   Proceed with unit schedule Follow the [facility] Inpatient Flowsheet.   

## 2019-12-25 NOTE — [facility] Counselor (Signed)
Adult Comprehensive Assessment  Patient ID: Tiffany Weber, female   DOB: 1995-10-13, 24 y.o.   MRN: [id number]  Information Source: Information source: Patient  Current Stressors:  Patient states their primary concerns and needs for treatment are:: Pt reports "suicidal ideation and sadness for past 6 months" Pt denies plan to harm hersef Patient states their goals for this hospitilization and ongoing recovery are:: "I want to go home" Educational / Learning stressors: None reported Employment / Job issues: Full time student Family Relationships: Pt reports close knit family Financial / Lack of resources (include bankruptcy): None reported Housing / Lack of housing: Stable housing Physical health (include injuries & life threatening diseases): None reported Social relationships: "Normal" Substance abuse: Pt denies use  Living/Environment/Situation:  Living Arrangements: Parent Who else lives in the home?: Pt, brother, parents How long has patient lived in current situation?: "My whole life" What is atmosphere in current home: Supportive, Loving, Comfortable  Family History:  Marital status: Separated Separated, when?: 9 months What types of issues is patient dealing with in the relationship?: "No contact with each other" Does patient have children?: No  Childhood History:  By whom was/is the patient raised?: Mother Description of patient's relationship with caregiver when they were a child: "Inseparable" Patient's description of current relationship with people who raised him/her: "Inseparable" How were you disciplined when you got in trouble as a child/adolescent?: "No discipline" Does patient have siblings?: Yes Number of Siblings: 1 Description of patient's current relationship with siblings: Pt reports brother age 35, "best friends" Did patient suffer any verbal/emotional/physical/sexual abuse as a child?: No Did patient suffer from severe childhood neglect?: No Has patient  ever been sexually abused/assaulted/raped as an adolescent or adult?: No Was the patient ever a victim of a crime or a disaster?: No Witnessed domestic violence?: No Has patient been affected by domestic violence as an adult?: No  Education:  Currently a Consulting civil engineer?: Yes Name of school: [employer] How long has the patient attended?: 1 yr Learning disability?: No  Employment/Work Situation:   Employment situation: Consulting civil engineer What is the longest time patient has a held a job?: 2 months Where was the patient employed at that time?: [employer] in [place] Has patient ever been in the [employer]?: No  Financial Resources:   Surveyor, quantity resources: Medicaid, Support from parents / caregiver  Alcohol/Substance Abuse:   What has been your use of drugs/alcohol within the last 12 months?: Pt denies use If attempted suicide, did drugs/alcohol play a role in this?: No Alcohol/Substance Abuse Treatment Hx: Denies past history Has alcohol/substance abuse ever caused legal problems?: No  Social Support System:   Conservation officer, nature Support System: Good Describe Community Support System: Mother Type of faith/religion: [employer]ri[employer] Knuckles How does patient's faith help to cope with current illness?: "Prayer, go to church"  Leisure/Recreation:   Do You Have Hobbies?: Yes Leisure and Hobbies: Watch movies, study  Strengths/Needs:   What is the patient's perception of their strengths?: "Organize my pens and read my medical books"  Discharge Plan:   Currently receiving community mental health services: No Patient states concerns and preferences for aftercare planning are: Pt request referral for therapy. Pt reports she will continue to see PCP([facility]) for [employer] management Patient states they will know when they are safe and ready for discharge when: "Im not going to do anything" Does patient have access to transportation?: Yes Does patient have financial barriers related to  discharge medications?: No Will patient be returning to same living  situation after discharge?: Yes (Lives with parent)  Summary/Recommendations:   Summary and Recommendations (to be completed by the evaluator): Pt is a 24 yr old female who presents voluntarily to [facility] due to worsening depression and suicidal ideation. Pt is a full time Consulting civil engineer at [employer]. Pt denies any history of drug or alcohol use. Pt denies any history of trauma or abuse. Pt states having a strong support system. Pt request referral for therapy and says she will follow up with PCP for medication management.Patient will benefit from crisis stabilization, medication evaluation, group therapy and psychoeducation, in addition to case management for discharge planning. At discharge it is recommended that patient adhere to the established discharge plan and continue in treatment.  Daking Westervelt Philip Aspen. 12/25/2019

## 2019-12-25 NOTE — BH Assessment (Addendum)
Disposition: Per Nira Conn, NP pt meets inpatient criteria. Per Hassie Bruce, RN pending neg covid, pt has been accepted to [facility], Rm/Bed 304-1.   Dolores Frame, MSW, LCSW-A Triage Specialist [phone]
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
## 2019-12-26 LAB — COMPREHENSIVE METABOLIC PANEL
ALT: 22 U/L (ref 0–44)
AST: 17 U/L (ref 15–41)
Albumin: 4 g/dL (ref 3.5–5.0)
Alkaline Phosphatase: 94 U/L (ref 38–126)
Anion gap: 12 (ref 5–15)
BUN: 13 mg/dL (ref 6–20)
CO2: 23 mmol/L (ref 22–32)
Calcium: 9.3 mg/dL (ref 8.9–10.3)
Chloride: 103 mmol/L (ref 98–111)
Creatinine, Ser: 0.67 mg/dL (ref 0.44–1.00)
GFR calc Af Amer: >60 mL/min (ref >60)
GFR calc non Af Amer: >60 mL/min (ref >60)
Glucose, Bld: 92 mg/dL (ref 70–99)
Potassium: 3.5 mmol/L (ref 3.5–5.1)
Sodium: 138 mmol/L (ref 135–145)
Total Bilirubin: 0.9 mg/dL (ref 0.3–1.2)
Total Protein: 7.8 g/dL (ref 6.5–8.1)

## 2019-12-26 MED ORDER — ONDANSETRON 4 MG PO TBDP
4.0000 mg | ORAL_TABLET | Freq: Three times a day (TID) | ORAL | Status: DC | PRN
  Administered 2019-12-26: 4 mg via ORAL
  Filled 2019-12-26: qty 1

## 2019-12-26 NOTE — Progress Notes (Signed)
NP, Nira Conn notified of pt's elevated BP and pulse this AM. Pt also c/o nausea for which a PRN zofran order was obtained and administered. Pt provided ice chips, 240 mLs of gatorade, and her water pitcher was refilled. Pt continues to be encouraged to drink fluids and eat her meals. NP also said to continue to encourage her to drink fluids in case she may be dehydrated. Pt also completed her CMP that was ordered this AM. Oncoming nurse will be notified and vital signs will be reassessed.   12/26/19 0617  Vital Signs  Pulse Rate (!) 118  Pulse Rate Source Dinamap  BP (!) 169/108  BP Method Automatic  Patient Position (if appropriate) Standing  Oxygen Therapy  SpO2 98 %  O2 Device Room [id number]

## 2019-12-26 NOTE — Progress Notes (Signed)
Pt's mother "Junious Dresser" called for an update on pt's status. Pt's mother was informed about pt's condition deteriorating (no appetite for 2 days, N/V, not being able to keep any fluids down) and that she was sent to [facility]. Pt's mother was concerned as she had not been informed . She was informed that she can get the latest update by calling [facility]. Pt's mother also requested for her to be called once she has returned to the facility.

## 2019-12-26 NOTE — Progress Notes (Signed)
[facility] Progress Note  12/26/2019 10:17 AM [facility]  MRN:  [id number] Subjective: Patient states "today I am feeling better, less anxious, like I feel I can breathe again " She reports improvement of nausea and vomiting which she had presented with yesterday.  Has not had any vomiting today.  She also denies diarrhea today.  She states she was able to have her breakfast and has been drinking fluids throughout the day without difficulty. Denies medication side effects. Objective: I have reviewed case with treatment team and have met with patient. 24 year old female, presented to [facility] voluntarily reporting depression and suicidal ideations with thoughts of driving her car off the road.  She reported family stressors/tension and recent death of her grandmother as a contributing stressor.  Yesterday patient had presented with significant/repeated episodes of vomiting and had also reported diarrhea. P.O. fluids were encouraged but she continued to vomit. Today the symptoms are improved/resolved.  Currently denies nausea, denies any vomiting thus far today, has been tolerating p.o. intake well.  She also reports  diarrhea has resolved. BP has been trending high.  Patient denies a history of hypertension.  She attributes this to anxiety about being in an inpatient unit in an unfamiliar surroundings.  Blood pressure and pulse have trended down, this a.m. 158/92, pulse 94. *No  symptoms to suggest serotonin syndrome.  No restlessness, no agitation, no abnormal involuntary movements or myoclonus noted or reported.  As noted, vomiting and diarrhea now resolved. She received Zofran as needed for nausea without side effects. EKG was repeated this a.m.-NSR, HR 85, QTc 449 With her expressed consent I spoke with her mother.  Mother states that patient seems better today, close to her baseline, with improved affect. No disruptive or agitated behaviors on unit, visible in milieu. Labs reviewed-sodium 140,  potassium 3.7, BUN 11, creatinine 0.63, CO2 22, WBC 14.3, hemoglobin 13.3, hematocrit 40.8, pregnancy test negative, hemoglobin A1c 4.9, TSH 3.6  Principal Problem: Severe recurrent major depression without psychotic features ([facility]) Diagnosis: Principal Problem:   Severe recurrent major depression without psychotic features ([facility]) Active Problems:   Suicidal ideation  Total Time spent with patient: 20 minutes  Past Psychiatric History:   Past Medical History:  Past Medical History:  Diagnosis Date  . Endometriosis   . Frequent headaches     Past Surgical History:  Procedure Laterality Date  . oophrectomy Right    Family History:  Family History  Problem Relation Age of Onset  . Hypertension Mother   . Hypertension Father   . Diabetes Father   . Diabetes Maternal Grandmother   . Stroke Maternal Grandmother   . Diabetes Maternal Grandfather   . Stroke Maternal Grandfather   . Diabetes Paternal Grandmother   . Stroke Paternal Grandmother   . Diabetes Paternal Grandfather   . Stroke Paternal Grandfather   . Aneurysm Other    Family Psychiatric  History:  Social History:  Social History   Substance and Sexual Activity  Alcohol Use Never     Social History   Substance and Sexual Activity  Drug Use Never    Social History   Socioeconomic History  . Marital status: Divorced    Spouse name: Not on file  . Number of children: 0  . Years of education: Not on file  . 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 Smoking status: Never Smoker  . Smokeless tobacco: Never Used  Vaping Use  . Vaping Use: Never used  Substance and Sexual Activity  . Alcohol use: Never  . Drug use: Never  . Sexual activity: Not on file  Other Topics Concern  . Not on file  Social History Narrative   Caffeine- none   Social Determinants of Health   Financial Resource Strain:   . Difficulty of Paying  Living Expenses:   Food Insecurity:   . Worried About Charity fundraiser in the Last Year:   . Arboriculturist in the Last Year:   Transportation Needs:   . Film/video editor (Medical):   Marland Kitchen Lack of Transportation (Non-Medical):   Physical Activity:   . Days of Exercise per Week:   . Minutes of Exercise per Session:   Stress:   . Feeling of Stress :   Social Connections:   . Frequency of Communication with Friends and Family:   . Frequency of Social Gatherings with Friends and Family:   . Attends Religious Services:   . Active Member of Clubs or Organizations:   . Attends Archivist Meetings:   Marland Kitchen Marital Status:    Additional Social History:    Pain Medications: See MAR Prescriptions: See MAR Over the Counter: See MAR History of alcohol / drug use?: No history of alcohol / drug abuse  Sleep: Good  Appetite:  Improving  Current Medications: Current Facility-Administered Medications  Medication Dose Route Frequency Provider Last Rate Last Admin  . acetaminophen (TYLENOL) tablet 650 mg  650 mg Oral Q6H PRN [facility]      . FLUoxetine (PROZAC) capsule 40 mg  40 mg Oral Daily [facility]   40 mg at 12/26/19 0856  . LORazepam (ATIVAN) tablet 0.5 mg  0.5 mg Oral Q6H PRN [facility]      . ondansetron (ZOFRAN-ODT) disintegrating tablet 4 mg  4 mg Oral Q8H PRN [facility]   4 mg at 12/26/19 8337  . penicillin v potassium (VEETID) tablet 500 mg  500 mg Oral BID [facility]        Lab Results:  Results for orders placed or performed during the [facility] encounter of 12/25/19 (from the past 48 hour(s))  Pregnancy, urine     Status: None   Collection Time: 12/25/19  4:06 AM  Result Value Ref Range   Preg Test, Ur NEGATIVE NEGATIVE    Comment:        THE SENSITIVITY OF THIS METHODOLOGY IS >20 mIU/mL. Performed at [facility], [place] [address] [place], [place] [address]   Urine rapid drug screen (hosp  performed)not at [facility]     Status: None   Collection Time: 12/25/19  4:06 AM  Result Value Ref Range   Opiates NONE DETECTED NONE DETECTED   Cocaine NONE DETECTED NONE DETECTED   Benzodiazepines NONE DETECTED NONE DETECTED   Amphetamines NONE DETECTED NONE DETECTED   Tetrahydrocannabinol NONE DETECTED NONE DETECTED   Barbiturates NONE DETECTED NONE DETECTED    Comment: (NOTE) DRUG SCREEN FOR MEDICAL PURPOSES ONLY.  IF CONFIRMATION IS NEEDED FOR ANY PURPOSE, NOTIFY LAB WITHIN 5 DAYS.  LOWEST DETECTABLE LIMITS FOR URINE DRUG SCREEN Drug Class                     Cutoff (ng/mL) Amphetamine and metabolites    1000 Barbiturate and metabolites    200 Benzodiazepine  886 Tricyclics and metabolites     300 Opiates and metabolites        300 Cocaine and metabolites        300 THC                            50 Performed at [facility], [place] [address], [place], [place] [address]   SARS Coronavirus 2 by RT PCR ([facility] order, performed in [facility] lab) Nasopharyngeal Nasopharyngeal Swab     Status: None   Collection Time: 12/25/19  4:19 AM   Specimen: Nasopharyngeal Swab  Result Value Ref Range   SARS Coronavirus 2 NEGATIVE NEGATIVE    Comment: (NOTE) SARS-CoV-2 target nucleic acids are NOT DETECTED.  The SARS-CoV-2 RNA is generally detectable in upper and lower respiratory specimens during the acute phase of infection. The lowest concentration of SARS-CoV-2 viral copies this assay can detect is 250 copies / mL. A negative result does not preclude SARS-CoV-2 infection and should not be used as the sole basis for treatment or other patient management decisions.  A negative result may occur with improper specimen collection / handling, submission of specimen other than nasopharyngeal swab, presence of viral mutation(s) within the areas targeted by this assay, and inadequate number of viral copies (<250 copies / mL). A negative result must  be combined with clinical observations, patient history, and epidemiological information.  Fact Sheet for Patients:   StrictlyIdeas.no  Fact Sheet for Healthcare Providers: BankingDealers.co.za  This test is not yet approved or  cleared by the [place] FDA and has been authorized for detection and/or diagnosis of SARS-CoV-2 by FDA under an Emergency Use Authorization (EUA).  This EUA will remain in effect (meaning this test can be used) for the duration of the COVID-19 declaration under Section 564(b)(1) of the Act, 21 U.S.C. section 360bbb-3(b)(1), unless the authorization is terminated or revoked sooner.  Performed at [facility], [place] [address], [place], [place] [address]   CBC     Status: Abnormal   Collection Time: 12/25/19  6:37 AM  Result Value Ref Range   WBC 12.6 (H) 4.0 - 10.5 K/uL   RBC 4.90 3.87 - 5.11 MIL/uL   Hemoglobin 13.3 12.0 - 15.0 g/dL   HCT 41.4 36 - 46 %   MCV 84.5 80.0 - 100.0 fL   MCH 27.1 26.0 - 34.0 pg   MCHC 32.1 30.0 - 36.0 g/dL   RDW 13.3 11.5 - 15.5 %   Platelets 362 150 - 400 K/uL   nRBC 0.0 0.0 - 0.2 %    Comment: Performed at [facility], [place] [address], [place], [place] [address]  Comprehensive metabolic panel     Status: Abnormal   Collection Time: 12/25/19  6:37 AM  Result Value Ref Range   Sodium 138 135 - 145 mmol/L   Potassium 3.4 (L) 3.5 - 5.1 mmol/L   Chloride 105 98 - 111 mmol/L   CO2 23 22 - 32 mmol/L   Glucose, Bld 92 70 - 99 mg/dL    Comment: Glucose reference range applies only to samples taken after fasting for at least 8 hours.   BUN 14 6 - 20 mg/dL   Creatinine, Ser 0.67 0.44 - 1.00 mg/dL   Calcium 9.6 8.9 - 10.3 mg/dL   Total Protein 8.4 (H) 6.5 - 8.1 g/dL   Albumin 4.2 3.5 - 5.0 g/dL   AST 21  15 - 41 U/L   ALT 25 0 - 44 U/L   Alkaline Phosphatase 100 38 - 126 U/L   Total Bilirubin 0.4 0.3 - 1.2 mg/dL   GFR calc non Af Amer  >60 >60 mL/min   GFR calc Af Amer >60 >60 mL/min   Anion gap 10 5 - 15    Comment: Performed at [facility], [place] [address], [place], [place] [address]  Hemoglobin A1c     Status: None   Collection Time: 12/25/19  6:37 AM  Result Value Ref Range   Hgb A1c MFr Bld 4.9 4.8 - 5.6 %    Comment: (NOTE) Pre diabetes:          5.7%-6.4%  Diabetes:              >6.4%  Glycemic control for   <7.0% adults with diabetes    Mean Plasma Glucose 93.93 mg/dL    Comment: Performed at [facility] [address] [place], [place] [address]  Lipid panel     Status: Abnormal   Collection Time: 12/25/19  6:37 AM  Result Value Ref Range   Cholesterol 185 0 - 200 mg/dL   Triglycerides 163 (H) <150 mg/dL   HDL 43 >40 mg/dL   Total CHOL/HDL Ratio 4.3 RATIO   VLDL 33 0 - 40 mg/dL   LDL Cholesterol 109 (H) 0 - 99 mg/dL    Comment:        Total Cholesterol/HDL:CHD Risk Coronary Heart Disease Risk Table                     Men   Women  1/2 Average Risk   3.4   3.3  Average Risk       5.0   4.4  2 X Average Risk   9.6   7.1  3 X Average Risk  23.4   11.0        Use the calculated Patient Ratio above and the CHD Risk Table to determine the patient's CHD Risk.        ATP III CLASSIFICATION (LDL):  <100     mg/dL   Optimal  100-129  mg/dL   Near or Above                    Optimal  130-159  mg/dL   Borderline  160-189  mg/dL   High  >190     mg/dL   Very High Performed at [facility] [address] [place], [place] [address]   TSH     Status: None   Collection Time: 12/25/19  6:37 AM  Result Value Ref Range   TSH 3.601 0.350 - 4.500 uIU/mL    Comment: Performed by a 3rd Generation assay with a functional sensitivity of <=0.01 uIU/mL. Performed at [facility], [place] [address] [place], [place] [address]   Comprehensive metabolic panel     Status: None   Collection Time: 12/26/19  6:34 AM  Result Value Ref Range   Sodium 138  135 - 145 mmol/L   Potassium 3.5 3.5 - 5.1 mmol/L   Chloride 103 98 - 111 mmol/L   CO2 23 22 - 32 mmol/L   Glucose, Bld 92 70 - 99 mg/dL    Comment: Glucose reference range applies only to samples taken after fasting for at least 8 hours.   BUN 13 6 - 20 mg/dL   Creatinine, Ser  0.67 0.44 - 1.00 mg/dL   Calcium 9.3 8.9 - 10.3 mg/dL   Total Protein 7.8 6.5 - 8.1 g/dL   Albumin 4.0 3.5 - 5.0 g/dL   AST 17 15 - 41 U/L   ALT 22 0 - 44 U/L   Alkaline Phosphatase 94 38 - 126 U/L   Total Bilirubin 0.9 0.3 - 1.2 mg/dL   GFR calc non Af Amer >60 >60 mL/min   GFR calc Af Amer >60 >60 mL/min   Anion gap 12 5 - 15    Comment: Performed at [facility], [place] [address] [place], [place] [address]    Blood Alcohol level:  No results found for: [facility]  Metabolic Disorder Labs: Lab Results  Component Value Date   HGBA1C 4.9 12/25/2019   MPG 93.93 12/25/2019   No results found for: PROLACTIN Lab Results  Component Value Date   CHOL 185 12/25/2019   TRIG 163 (H) 12/25/2019   HDL 43 12/25/2019   CHOLHDL 4.3 12/25/2019   VLDL 33 12/25/2019   LDLCALC 109 (H) 12/25/2019    Physical Findings: AIMS:  , ,  ,  ,    CIWA:    COWS:     Musculoskeletal: Strength & Muscle Tone: within normal limits Gait & Station: normal Patient leans: N/A  Psychiatric Specialty Exam: Physical Exam  Review of Systems denies headache, denies chest pain, denies dyspnea, vomiting resolved, nausea improved, denies diarrhea today.  No fever (temperature today 97.8) no chills  Blood pressure (!) 158/92, pulse 94, temperature 97.8 F (36.6 C), temperature source Oral, resp. rate 20, height _0  (1.575 m), weight 128.4 kg, last menstrual period 12/03/2019, SpO2 98 %.Body mass index is 51.76 kg/m.  General Appearance: Casual  Eye Contact:  Good  Speech:  Normal Rate  Volume:  Normal  Mood:  Reports she is feeling better  Affect:  Presents with improved affect, fuller in range, less anxious   Thought Process:  Linear and Descriptions of Associations: Intact  Orientation:  Full (Time, Place, and Person)  Thought Content:  no hallucinations, no delusions, not internally preoccupied  Suicidal Thoughts:  No denies suicidal or self-injurious ideations, denies homicidal or violent ideations-contract for safety on unit  Homicidal Thoughts:  No  Memory:  Recent and remote grossly intact  Judgement:  Other:  Fair/improving  Insight:  Fair/improving  Psychomotor Activity:  Normal-no psychomotor restlessness or agitation, presents calm and in no acute distress  Concentration:  Concentration: Good and Attention Span: Good  Recall:  Good  Fund of Knowledge:  Good  Language:  Good  Akathisia:  Negative  Handed:  Right  AIMS (if indicated):     Assets:  Communication Skills Desire for Improvement Resilience  ADL's:  Intact  Cognition:  WNL  Sleep:  Number of Hours: 4.25   Assessment:  24 year old female, presented to [facility] voluntarily reporting depression and suicidal ideations with thoughts of driving her car off the road.  She reported family stressors/tension and recent death of her grandmother as a contributing stressor.  Today patient presents with improvement.  Nausea/vomiting improved/resolved.  Today tolerating p.o. intake well.  BP/heart rate trending down.  Describes mood is improved and presents with a fuller range of affect/less anxious today.  Denies SI and presents future oriented.  Mother provided collateral information/corroborates patient is improving.  We discussed medication options.  At this time prefers to continue monotherapy with Prozac.  Yesterday we had discussed option of adding BuSpar to her regimen to  help address anxiety symptoms, at this time prefers not to start a new medication.  Treatment Plan Summary: Daily contact with patient to assess and evaluate symptoms and progress in treatment, Medication management, Plan Inpatient treatment and Medications as  below Encourage group and milieu participation Continue Prozac 40 mg daily Continue Ativan 0.5 mg every 6 hours for anxiety as needed Completing PCN antibiotic course for strep throat-reports this was diagnosed about a week ago at which time she had been started on antibiotics. Treatment team working on disposition planning options [facility], [facility] 12/26/2019, 10:17 AM

## 2019-12-26 NOTE — Progress Notes (Addendum)
Pt returned from [facility] accompanied by her assigned MHT. Pt reports feeling a lot better in comparison to earlier today. Pt is alert and oriented X 4. Pt currently denies having any pain, nausea, vomiting, or any further complaints. Pt said she received zofran po in the ED and it was effective. Pt has a steady gait and was provided fluids. She was provided a pitcher with ice water. Pt was educated and encouraged about the importance of adequate nutrition. Maintaining adequate fluid intake and eating her meals. Pt demonstrated understanding. Pt informed to alert staff immediately if she starts not feeling well. Pt denies SI/HI and AVH. Active listening, reassurance, and support provided. Q 15 min safety checks continue. Pt's safety has been maintained.

## 2019-12-26 NOTE — Progress Notes (Signed)
EKG results placed on the outside of shadow chart   Normal sinus rhythm Normal ECG  QT/QTc  378/449 ms

## 2019-12-26 NOTE — Progress Notes (Signed)
[place] NOVEL CORONAVIRUS (COVID-19) DAILY CHECK-OFF SYMPTOMS - answer yes or no to each - every day NO YES  Have you had a fever in the past 24 hours?  . Fever (Temp > 37.80C / 100F) X   Have you had any of these symptoms in the past 24 hours? . New Cough .  Sore Throat  .  Shortness of Breath .  Difficulty Breathing .  Unexplained Body Aches   X   Have you had any one of these symptoms in the past 24 hours not related to allergies?   . Runny Nose .  Nasal Congestion .  Sneezing   X   If you have had runny nose, nasal congestion, sneezing in the past 24 hours, has it worsened?  X   EXPOSURES - check yes or no X   Have you traveled outside the state in the past 14 days?  X   Have you been in contact with someone with a confirmed diagnosis of COVID-19 or PUI in the past 14 days without wearing appropriate PPE?  X   Have you been living in the same home as a person with confirmed diagnosis of COVID-19 or a PUI (household contact)?    X   Have you been diagnosed with COVID-19?    X              What to do next: Answered NO to all: Answered YES to anything:   Proceed with unit schedule Follow the [facility] Inpatient Flowsheet.   

## 2019-12-26 NOTE — Progress Notes (Signed)
Patient did not attend morning group.  

## 2019-12-26 NOTE — Progress Notes (Signed)
D. Pt presents as somewhat anxious, but smiles during interactions. Pt denies nausea,but reports feeling somewhat fatigued upon exertion. Pt spent the majority of the shift in bed. Pt currently denies SI/HI and AVH  A. Labs and vitals monitored. Pt supported emotionally and encouraged to express concerns and ask questions.  R. Pt remains safe with 15 minute checks. Will continue POC.

## 2019-12-26 NOTE — Progress Notes (Signed)
   12/25/19 0055  COVID-19 Daily Checkoff  Have you had a fever (temp > 37.80C/100F)  in the past 24 hours?  No  COVID-19 EXPOSURE  Have you traveled outside the state in the past 14 days? No  Have you been in contact with someone with a confirmed diagnosis of COVID-19 or PUI in the past 14 days without wearing appropriate PPE? No  Have you been living in the same home as a person with confirmed diagnosis of COVID-19 or a PUI (household contact)? No  Have you been diagnosed with COVID-19? No

## 2019-12-27 MED ORDER — ONDANSETRON 4 MG PO TBDP: 4.0000 mg | ORAL_TABLET | Freq: Three times a day (TID) | ORAL | 0 refills | Status: AC | PRN

## 2019-12-27 MED ORDER — FLUOXETINE HCL 40 MG PO CAPS: 40.0000 mg | ORAL_CAPSULE | Freq: Every day | ORAL | 3 refills | Status: AC

## 2019-12-27 MED ORDER — LOPERAMIDE HCL 2 MG PO CAPS
2.0000 mg | ORAL_CAPSULE | Freq: Once | ORAL | Status: AC
  Administered 2019-12-27: 2 mg via ORAL
  Filled 2019-12-27 (×2): qty 1

## 2019-12-27 NOTE — Progress Notes (Addendum)
D: Tiffany Weber presents pleasant and calm. She denies any feelings of depression today. Reports her feelings of hopelessness today is "zero" and rates her anxiety as 1 on 1-10 scale. She states her goal today is to focus on her future and not everyone else. She denies any complaints at present. She denies SI,HI, and AVH. Pt is pending discharge today.   A:  Medication education given with administration.Compliant with medications. Maintain level 3 fifteen minute safety checks.   R: Verbally contracts for safety.   [place] NOVEL CORONAVIRUS (COVID-19) DAILY CHECK-OFF SYMPTOMS - answer yes or no to each - every day NO YES  Have you had a fever in the past 24 hours?   Fever (Temp > 37.80C / 100F) X    Have you had any of these symptoms in the past 24 hours?  New Cough   Sore Throat    Shortness of Breath   Difficulty Breathing   Unexplained Body Aches   X    Have you had any one of these symptoms in the past 24 hours not related to allergies?    Runny Nose   Nasal Congestion   Sneezing   X    If you have had runny nose, nasal congestion, sneezing in the past 24 hours, has it worsened?   X    EXPOSURES - check yes or no X    Have you traveled outside the state in the past 14 days?   X    Have you been in contact with someone with a confirmed diagnosis of COVID-19 or PUI in the past 14 days without wearing appropriate PPE?   X    Have you been living in the same home as a person with confirmed diagnosis of COVID-19 or a PUI (household contact)?     X    Have you been diagnosed with COVID-19?     X                                                                                                                             What to do next: Answered NO to all: Answered YES to anything:    Proceed with unit schedule Follow the [facility] Inpatient Flowsheet.

## 2019-12-27 NOTE — Progress Notes (Signed)
Pt A&O x4. Denies any complaints. Discharged from [facility] with family member. Denies SI,HI or AVH. Contracts for safety.

## 2019-12-27 NOTE — Progress Notes (Signed)
Pt reports that her mood has significantly changed and she now feels a "different" kind of happiness. She shared that this admission has allowed her to be "by myself, think, process, and breathe." Pt has found this time to be very beneficial and has learned that upon discharge "not to be on phone as much constantly." She also wants some time and space from some of her family members. She plans on spending more time with her mother who is supportive. Shares that some of her family members would talk about her grades in school or always something negative about her and then pile their stress on to her. She has spent the majority of tonight in her bedroom, but has been seen in the dayroom interacting with other patients.   She said that her nausea has been intermittent, "on and off." Pt informed that she can have her zofran PRN and denies the need at the moment. She did have one episode of dry heaving tonight without any emesis. She did c/o a headache rated a 4 on a pain scale of 0-10. She became worried from the headache because her grandfather had died of a brain aneurysm and so that brings her back memories. She said she starting sweating, but is happy since she was able to calm herself down. No distress noted at this time. Active listening, reassurance, and support provided.  Pt informed that she has PRN tylenol, but refused to take it since she's used to taking advil at home instead. NP, Nira Conn notified at 2213 and prefers pt not to take Advil since she was vomiting yesterday. Pt informed and said that her headache had decreased. Pt encouraged to rest and alert staff of any further needs. She denies SI/HI and AVH. Q 15 min safety checks continue. Pt's safety has been maintained.   12/26/19 2045  Psych Admission Type (Psych Patients Only)  Admission Status Voluntary  Psychosocial Assessment  Patient Complaints Depression  Eye Contact Fair  Facial Expression Other (Comment) (appropriate)  Affect  Appropriate to circumstance  Speech Logical/coherent  Interaction Assertive  Motor Activity Other (Comment) (WNL)  Appearance/Hygiene Unremarkable  Behavior Characteristics Cooperative;Appropriate to situation;Calm  Mood Pleasant  Thought Process  Coherency WDL  Content Blaming others  Delusions None reported or observed  Perception WDL  Hallucination None reported or observed  Judgment Poor  Confusion None  Danger to Self  Current suicidal ideation? Denies  Danger to Others  Danger to Others None reported or observed

## 2019-12-27 NOTE — Progress Notes (Signed)
  [facility] Adult Case Management Discharge Plan :  Will you be returning to the same living situation after discharge:  Yes,  with family At discharge, do you have transportation home?: Yes,  mother Do you have the ability to pay for your medications: No.  Will need assistance in the community, Medicaid is only for family planning  Release of information consent forms completed and emailed to Medical Records, then turned in to Medical Records by CSW.   Patient to Follow up at:  Follow-up Information    [facility]. Schedule an appointment as soon as possible for a visit.   Why: A referral has been made on your behalf to this provider.  Please call them on Monday 8/9 to ask for a specific appointment to be set for you. Contact information: [address] [place] [address] [phone]               Next level of care provider has access to [facility] Link:no  Safety Planning and Suicide Prevention discussed: Yes,  with mother     Has patient been referred to the Quitline?: N/A patient is not a smoker  Patient has been referred for addiction treatment: N/A  Lynnell Chad, LCSW 12/27/2019, 10:17 AM

## 2019-12-27 NOTE — Progress Notes (Signed)
Adult Psychoeducational Wrap-up Group Note     Group Topic/Focus:  Goals Group:   The focus of this group is to help patients review daily goals and to reflect on how goals were implemented throughout the day and the effect they had. In addition, the goal was to revise goals if needed and to discuss how the patient can incorporate goal setting into their daily lives to aide in recovery.   Participation Level:  Pt. DID NOT attend group.

## 2019-12-27 NOTE — Progress Notes (Signed)
Pt has had one episode of diarrhea this AM and reports having soiled her fitted sheet some. Pt provided a new fitted sheet. Pt is currently on penicillin v potassium for strep throat which may be causing the diarrhea. NP, Nira Conn notified of pt's request for an imodium order PRN.

## 2019-12-27 NOTE — [facility] Suicide Risk Assessment (Signed)
[facility] INPATIENT:  Family/Significant Other Suicide Prevention Education  Suicide Prevention Education:  Education Completed; Gae Gallop, mother [phone],  (name of family member/significant other) has been identified by the patient as the family member/significant other with whom the patient will be residing, and identified as the person(s) who will aid the patient in the event of a mental health crisis (suicidal ideations/suicide attempt).  With written consent from the patient, the family member/significant other has been provided the following suicide prevention education, prior to the and/or following the discharge of the patient.  The suicide prevention education provided includes the following:  Suicide risk factors  Suicide prevention and interventions  National Suicide Hotline telephone number  [place] assessment telephone number  [facility] Emergency Assistance 911  [facility] and/or Residential Mobile Crisis Unit telephone number  Request made of family/significant other to:  Remove weapons (e.g., guns, rifles, knives), all items previously/currently identified as safety concern.    Remove drugs/medications (over-the-counter, prescriptions, illicit drugs), all items previously/currently identified as a safety concern.  The family member/significant other verbalizes understanding of the suicide prevention education information provided.  The family member/significant other agrees to remove the items of safety concern listed above.  Mother expressed no concerns about patient discharging today.  She will come pick patient up between 12:30-1:00pm.  A brochure is being sent home regarding suicide prevention to supplement what was shared with mother and what she states she already knows.  Carloyn Jaeger Grossman-Orr 12/27/2019, 10:09 AM

## 2019-12-27 NOTE — [facility] Suicide Risk Assessment (Addendum)
[facility] Discharge Suicide Risk Assessment   Principal Problem: Severe recurrent major depression without psychotic features [facility]) Discharge Diagnoses: Principal Problem:   Severe recurrent major depression without psychotic features (HCC) Active Problems:   Suicidal ideation   Total Time spent with patient: 30 minutes  Musculoskeletal: Strength & Muscle Tone: within normal limits Gait & Station: normal Patient leans: N/A  Psychiatric Specialty Exam: Review of Systems no headache at this time, no shortness of breath, no cough, improved nausea, no vomiting , no dysuria or urgency ,no fever, no chills  Blood pressure (!) 136/91, pulse (!) 107, temperature 98.7 F (37.1 C), temperature source Oral, resp. rate 18, height 5\' 2"  (1.575 m), weight 128.4 kg, last menstrual period 12/03/2019, SpO2 99 %.Body mass index is 51.76 kg/m.  Manual pulse 96 bpm.   General Appearance: Casual  Eye Contact::  Good  Speech:  Normal Rate409  Volume:  Normal  Mood:  improving mood, today presents euthymic  Affect:  Appropriate and Full Range  Thought Process:  Linear and Descriptions of Associations: Intact  Orientation:  Full (Time, Place, and Person)  Thought Content:  no hallucinations,no delusions, not internally preoccupied   Suicidal Thoughts:  No denies suicidal or self injurious ideations, denies homicidal or violent ideations  Homicidal Thoughts:  No  Memory:  recent and remote grossly intact   Judgement:  Other:  improving   Insight:  fair- improving  Psychomotor Activity:  Normal  Concentration:  Good  Recall:  Good  Fund of Knowledge:Good  Language: Good  Akathisia:  Negative  Handed:  Right  AIMS (if indicated):     Assets:  Communication Skills Desire for Improvement Resilience  Sleep:  Number of Hours: 7  Cognition: WNL  ADL's:  Intact   Mental Status Per Nursing Assessment::   On Admission:  Suicidal ideation indicated by patient, Suicide plan, Self-harm thoughts, Belief that  plan would result in death  Demographic Factors:  24 year old single female, lives with mother  Loss Factors: 23, separated, lives with parents , no children  Historical Factors: No prior psychiatric admissions, history of depression, suicidal ideations, history of anxiety  Risk Reduction Factors:   Sense of responsibility to family, Living with another person, especially a relative, Positive social support and Positive coping skills or problem solving skills  Continued Clinical Symptoms:  Today patient presents alert, attentive, well related, pleasant on approach, mood improved, affect appropriate, full in range, denies feeling anxious currently. Thought process linear. Denies SI, denies HI, no hallucinations,no delusions , presents future oriented. Behavior on unit in good control. No disruptive or agitated behaviors Tolerating medications ( Prozac) well .  She has completed PCN course which had been prescribed prior to admission for strep throat. Denies odynophagia,  or other symptoms. She feels PCN could have contributed to recent nausea, which is now improving . No vomiting today. Tolerating PO intake well. With her express consent I spoke with her mother who corroborates patient is improving and back to her baseline at this time , is in agreement with discharge. * WBC elevated - currently patient afebrile . Reports recent strep throat , symptoms now resolved . No other symptoms on ROS reported . Encouraged to follow up with her PCP for further monitoring /management.   Cognitive Features That Contribute To Risk:  No gross cognitive deficits noted upon discharge. Is alert , attentive, and oriented x 3   Suicide Risk:  Mild:  Suicidal ideation of limited frequency, intensity, duration, and specificity.  There are no identifiable plans, no associated intent, mild dysphoria and related symptoms, good self-control (both objective and subjective assessment), few other risk factors, and  identifiable protective factors, including available and accessible social support.   Follow-up Information    [facility] Follow up.   Why: A referral has been made on your behalf to this provider. Contact information: [address] [place] [address] [phone]               Plan Of Care/Follow-up recommendations:  Activity:  as tolerated Diet:  regular Tests:  NA Other:  See below  Patient is expressing readiness for discharge, and is planning on returning home- mother will pick her up later today. Follow up as above. Plans also to follow with her PCP in [facility] for medical follow as above.   Craige Cotta, MD 12/27/2019, 7:42 AM

## 2019-12-27 NOTE — Progress Notes (Signed)
   12/26/19 2045  COVID-19 Daily Checkoff  Have you had a fever (temp > 37.80C/100F)  in the past 24 hours?  No  COVID-19 EXPOSURE  Have you traveled outside the state in the past 14 days? No  Have you been in contact with someone with a confirmed diagnosis of COVID-19 or PUI in the past 14 days without wearing appropriate PPE? No  Have you been living in the same home as a person with confirmed diagnosis of COVID-19 or a PUI (household contact)? No  Have you been diagnosed with COVID-19? No
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
## 2020-01-09 ENCOUNTER — Ambulatory Visit
Admission: RE | Admit: 2020-01-09 | Discharge: 2020-01-09 | Disposition: A | Discharge status: Home / Self Care | Payer: Medicaid Other | Source: Ambulatory Visit | Attending: Diagnostic Neuroimaging | Admitting: Diagnostic Neuroimaging

## 2020-01-09 ENCOUNTER — Other Ambulatory Visit: Payer: Self-pay

## 2020-01-09 DIAGNOSIS — H4710 Unspecified papilledema: Secondary | ICD-10-CM

## 2020-01-09 DIAGNOSIS — Z8249 Family history of ischemic heart disease and other diseases of the circulatory system: Secondary | ICD-10-CM

## 2020-01-09 DIAGNOSIS — R519 Headache, unspecified: Secondary | ICD-10-CM

## 2020-01-09 DIAGNOSIS — H538 Other visual disturbances: Secondary | ICD-10-CM

## 2020-01-09 MED ORDER — GADOBENATE DIMEGLUMINE 529 MG/ML IV SOLN
20.0000 mL | Freq: Once | INTRAVENOUS | Status: AC | PRN
  Administered 2020-01-09: 20 mL via INTRAVENOUS

## 2020-01-11 ENCOUNTER — Encounter: Payer: Self-pay | Admitting: *Deleted

## 2020-01-11 ENCOUNTER — Telehealth: Payer: Self-pay | Admitting: *Deleted

## 2020-01-11 DIAGNOSIS — G932 Benign intracranial hypertension: Secondary | ICD-10-CM

## 2020-01-11 NOTE — Telephone Encounter (Signed)
Spoke with patient and informed her that MRI brain and MRA head scans are ok. She may consider LP for further evaluaiton of headaches / papilledema. She agreed to LP. I advised will let Dr Marjory Lies know. Patient verbalized understanding, appreciation.

## 2020-01-11 NOTE — Addendum Note (Signed)
Addended by: Joycelyn Schmid R on: 01/11/2020 04:59 PM   Modules accepted: Orders

## 2020-01-11 NOTE — Telephone Encounter (Signed)
Notified patient via mychart

## 2020-01-11 NOTE — Telephone Encounter (Signed)
Orders Placed This Encounter  Procedures  . DG FL GUIDED LUMBAR PUNCTURE    Suanne Marker, MD 01/11/2020, 4:59 PM Certified in Neurology, Neurophysiology and Neuroimaging  [facility] [address], [address] [place], [place] [address] [phone]

## 2020-01-28 ENCOUNTER — Other Ambulatory Visit: Payer: Medicaid Other

## 2020-06-14 ENCOUNTER — Ambulatory Visit: Payer: Self-pay | Admitting: Diagnostic Neuroimaging

## 2020-07-05 ENCOUNTER — Encounter: Payer: Self-pay | Admitting: Diagnostic Neuroimaging

## 2020-07-05 ENCOUNTER — Telehealth: Payer: Self-pay | Admitting: *Deleted

## 2020-07-05 ENCOUNTER — Ambulatory Visit: Payer: Self-pay | Admitting: Diagnostic Neuroimaging

## 2020-07-05 NOTE — Telephone Encounter (Signed)
Patient was no show for follow up today. 

## 2020-09-14 ENCOUNTER — Telehealth: Payer: Self-pay | Admitting: Diagnostic Neuroimaging

## 2020-09-14 DIAGNOSIS — G932 Benign intracranial hypertension: Secondary | ICD-10-CM

## 2020-09-14 DIAGNOSIS — H538 Other visual disturbances: Secondary | ICD-10-CM

## 2020-09-14 DIAGNOSIS — H4710 Unspecified papilledema: Secondary | ICD-10-CM

## 2020-09-14 DIAGNOSIS — G43109 Migraine with aura, not intractable, without status migrainosus: Secondary | ICD-10-CM

## 2020-09-14 NOTE — Telephone Encounter (Signed)
Called patient to discuss LP. She canceled FU, was no show for next FU and canceled her LP.  LVM requesting she call back.

## 2020-09-14 NOTE — Telephone Encounter (Signed)
Pt called and left Message asking got a new order for Lumbar Puncture .

## 2020-09-15 NOTE — Telephone Encounter (Signed)
Pt returned phone call. Would like a call back. 

## 2020-09-15 NOTE — Telephone Encounter (Signed)
LVM returning patients call.

## 2020-09-15 NOTE — Addendum Note (Signed)
Addended by: Arther Abbott on: 09/15/2020 11:46 AM   Modules accepted: Orders

## 2020-09-15 NOTE — Telephone Encounter (Signed)
Took call from phone staff and spoke with pt. She is having ongoing headaches. She saw eye doctor two days ago at [facility] In [place], [place] who told her she still had abnormalities on exam and recommended she complete LP and to contact neurologist. Advised we will need to get approval from Dr. Marjory Lies first to see if its ok to re-order or to see if she has to be seen first. Advised we will call her back.

## 2020-09-15 NOTE — Telephone Encounter (Addendum)
Spoke with Dr. Marjory Lies. He is ok to re-order LP for pt. We also need to set up a follow up appointment for pt. Called pt, relayed this. Scheduled follow up for 09/27/20 at 830am w/ Dr. Marjory Lies. Advised her to call back if she does not hear from someone in the next week to get LP scheduled. Placed new order for LP.

## 2020-09-21 NOTE — Telephone Encounter (Signed)
This was eval'd by [facility] in 2015 (see notes in care everywhere) and they said it was not significant and did not need any further testing or treatment. Ok to proceed with LP. -VRP

## 2020-09-21 NOTE — Telephone Encounter (Signed)
Called patient and informed her of Dr Richrd Humbles reply, okay to proceed with LP. She stated she canceled it today because she is sick. she's getting tested for Covid tomorrow. She will call and reschedule it when she is well,  verbalized understanding, appreciation.

## 2020-09-21 NOTE — Telephone Encounter (Signed)
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 Please advise.

## 2020-09-22 ENCOUNTER — Other Ambulatory Visit: Payer: Medicaid Other

## 2020-09-27 ENCOUNTER — Encounter: Payer: Self-pay | Admitting: Diagnostic Neuroimaging

## 2020-09-27 ENCOUNTER — Ambulatory Visit: Payer: Self-pay | Admitting: Diagnostic Neuroimaging

## 2020-09-27 ENCOUNTER — Telehealth: Payer: Self-pay | Admitting: *Deleted

## 2020-09-27 NOTE — Telephone Encounter (Signed)
Patient was no show for follow up today. 

## 2020-09-29 ENCOUNTER — Other Ambulatory Visit: Payer: Medicaid Other

## 2020-10-06 ENCOUNTER — Other Ambulatory Visit: Payer: Self-pay

## 2020-10-06 ENCOUNTER — Ambulatory Visit
Admission: RE | Admit: 2020-10-06 | Discharge: 2020-10-06 | Disposition: A | Discharge status: Home / Self Care | Payer: BC Managed Care – PPO | Source: Ambulatory Visit | Attending: Diagnostic Neuroimaging | Admitting: Diagnostic Neuroimaging

## 2020-10-06 VITALS — BP 129/95 | HR 78

## 2020-10-06 DIAGNOSIS — G43109 Migraine with aura, not intractable, without status migrainosus: Secondary | ICD-10-CM

## 2020-10-06 DIAGNOSIS — H538 Other visual disturbances: Secondary | ICD-10-CM

## 2020-10-06 DIAGNOSIS — H4710 Unspecified papilledema: Secondary | ICD-10-CM

## 2020-10-06 DIAGNOSIS — G932 Benign intracranial hypertension: Secondary | ICD-10-CM

## 2020-10-06 NOTE — Discharge Instructions (Signed)
Lumbar Puncture Discharge Instructions  1. Go home and rest quietly as needed. You may resume normal activities; however, do not exert yourself strongly or do any heavy lifting today and tomorrow.   2. DO NOT drive today.    3. You may resume your normal diet and medications unless otherwise indicated. Drink lots of extra fluids today and tomorrow.   4. The incidence of headache, nausea, or vomiting is about 5% (one in 20 patients).  If you develop a headache, lie flat for 24 hours and drink plenty of fluids until the headache goes away.  Caffeinated beverages may be helpful. If when you get up you still have a headache when standing, go back to bed and force fluids for another 24 hours.   5. If you develop severe nausea and vomiting or a headache that does not go away with the flat bedrest after 48 hours, please call [phone].   6. Call your physician for a follow-up appointment.  The results of your Lumbar Puncture will be sent directly to your physician and they will contact you.   7. If you have any questions or if complications develop after you arrive home, please call [phone].  Discharge instructions have been explained to the patient.  The patient, or the person responsible for the patient, fully understands these instructions.   Thank you for visiting our office today.  

## 2020-10-10 LAB — CSF CULTURE W GRAM STAIN
GRAM STAIN:: NONE SEEN
MICRO NUMBER:: 11910957
Result:: NO GROWTH
SPECIMEN QUALITY:: ADEQUATE

## 2020-10-10 LAB — PROTEIN, CSF: Total Protein, CSF: 23 mg/dL (ref 15–45)

## 2020-10-10 LAB — CSF CELL COUNT WITH DIFFERENTIAL
RBC Count, CSF: 450 cells/uL — ABNORMAL HIGH
WBC, CSF: 1 cells/uL (ref 0–5)

## 2020-10-10 LAB — GLUCOSE, CSF: Glucose, CSF: 57 mg/dL (ref 40–80)

## 2020-10-26 ENCOUNTER — Ambulatory Visit: Payer: BC Managed Care – PPO | Admitting: Diagnostic Neuroimaging

## 2020-10-26 ENCOUNTER — Encounter: Payer: Self-pay | Admitting: Diagnostic Neuroimaging

## 2020-10-26 VITALS — BP 149/103 | HR 101 | Ht 62.0 in | Wt 290.0 lb

## 2020-10-26 DIAGNOSIS — G932 Benign intracranial hypertension: Secondary | ICD-10-CM | POA: Diagnosis not present

## 2020-10-26 MED ORDER — ACETAZOLAMIDE 250 MG PO TABS: 250.0000 mg | ORAL_TABLET | Freq: Two times a day (BID) | ORAL | 12 refills | Status: DC

## 2020-10-26 NOTE — Patient Instructions (Signed)
idiopathic intracranial hypertension (pseudotumor cerebri)  - start acetazolamide 250mg  twice a day; after 1-2 weeks increase to 500mg  twice a day   - follow up eye exams  - gradual weight loss strategies reviewed

## 2020-10-26 NOTE — Progress Notes (Signed)
[facility]  PATIENT: Tiffany Weber DOB: Oct 17, 1995  REFERRING CLINICIAN: Jonita Albee, [facility] HISTORY FROM: patient  REASON FOR VISIT: follow up   HISTORICAL  CHIEF COMPLAINT:  Chief Complaint  Patient presents with  . Papilledema, migraines    Rm 6, FU to discuss LP results "saw eye dr 2 months ago and eye dr waiting on LP results before prescribing new glasses, she has FU; headaches are fine, everything is blurry"    HISTORY OF PRESENT ILLNESS:   UPDATE (10/26/20, VRP): Since last visit, vision problems continue. Papilledema stable per eye doctor. HA improved LP opening pressure 24cm H2O.     PRIOR HPI: 25 year old female here for evaluation of papilledema.  May 2021 patient had onset of left periorbital sharp headaches, blurred vision, seeing halos and visual disturbance.  Sometimes she would see a film over her left eye.  Patient went to eye doctor for evaluation of possible new glasses.  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  Patient does have remote history of migraine headaches at age 25 years old with throbbing headaches, nausea, sensitive to light and sound.  She was on topiramate for a while in the past.   REVIEW OF SYSTEMS: Full 14 system review of systems performed and negative with exception of: As per HPI.   ALLERGIES: No Known Allergies  HOME MEDICATIONS: Outpatient Medications Prior to Visit  Medication Sig Dispense Refill  . FLUoxetine (PROZAC) 40 MG capsule Take 1 capsule (40 mg total) by mouth daily. 30 capsule 3  . metFORMIN (GLUCOPHAGE) 1000 MG tablet Take 1,000 mg by mouth 2 (two) times daily with a meal.    . meloxicam (MOBIC) 15 MG tablet Take 1 tablet (15 mg total) by mouth daily. 30 tablet 0  . Rimegepant Sulfate (NURTEC) 75 MG TBDP Take 75 mg by mouth daily as needed. (Patient not taking: Reported on 12/25/2019) 8 tablet 6  . rizatriptan (MAXALT-MLT) 10 MG disintegrating  tablet Take 1 tablet (10 mg total) by mouth as needed for migraine. May repeat in 2 hours if needed (Patient not taking: Reported on 12/25/2019) 9 tablet 11  . topiramate (TOPAMAX) 50 MG tablet Take 1 tablet (50 mg total) by mouth 2 (two) times daily. (Patient not taking: Reported on 12/25/2019) 60 tablet 12   No facility-administered medications prior to visit.    PAST MEDICAL HISTORY: Past Medical History:  Diagnosis Date  . Endometriosis   . Frequent headaches     PAST SURGICAL HISTORY: Past Surgical History:  Procedure Laterality Date  . oophrectomy Right     FAMILY HISTORY: Family History  Problem Relation Age of Onset  . Hypertension Mother   . Hypertension Father   . Diabetes Father   . Diabetes Maternal Grandmother   . Stroke Maternal Grandmother   . Diabetes Maternal Grandfather   . Stroke Maternal Grandfather   . Diabetes Paternal Grandmother   . Stroke Paternal Grandmother   . Diabetes Paternal Grandfather   . Stroke Paternal Grandfather   . Aneurysm Other     SOCIAL HISTORY: Social History   Socioeconomic History  . Marital status: Divorced    Spouse name: Not on file  . Number of children: 0  . Years of education: Not on file  . 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 Smoking status: Never Smoker  . Smokeless tobacco: Never Used  Vaping Use  . Vaping Use: Never used  Substance and Sexual Activity  . Alcohol use: Never  . Drug use: Never  . Sexual activity: Not on file  Other Topics Concern  . Not on file  Social History Narrative   Caffeine- none   Social Determinants of Health   Financial Resource Strain: Not on file  Food Insecurity: Not on file  Transportation Needs: Not on file  Physical Activity: Not on file  Stress: Not on file  Social Connections: Not on file  Intimate Partner Violence: Not on file     PHYSICAL EXAM  GENERAL  EXAM/CONSTITUTIONAL: Vitals:  Vitals:   10/26/20 1430  BP: (!) 149/103  Pulse: (!) 101  Weight: 290 lb (131.5 kg)  Height: 5\' 2"  (1.575 m)   Body mass index is 53.04 kg/m. Wt Readings from Last 3 Encounters:  10/26/20 290 lb (131.5 kg)  12/09/19 284 lb (128.8 kg)  05/20/19 272 lb (123.4 kg)    Patient is in no distress; well developed, nourished and groomed; neck is supple  CARDIOVASCULAR:  Examination of carotid arteries is normal; no carotid bruits  Regular rate and rhythm, no murmurs  Examination of peripheral vascular system by observation and palpation is normal  EYES:  Ophthalmoscopic exam of optic discs and posterior segments is notable for MILD PAPILLEDEMA No exam data present  MUSCULOSKELETAL:  Gait, strength, tone, movements noted in Neurologic exam below  NEUROLOGIC: MENTAL STATUS:  No flowsheet data found.  awake, alert, oriented to person, place and time  recent and remote memory intact  normal attention and concentration  language fluent, comprehension intact, naming intact  fund of knowledge appropriate  CRANIAL NERVE:   2nd - MILD PAPILLEDEMA  2nd, 3rd, 4th, 6th - pupils equal and reactive to light, visual fields full to confrontation, extraocular muscles intact, no nystagmus  5th - facial sensation symmetric  7th - facial strength symmetric  8th - hearing intact  9th - palate elevates symmetrically, uvula midline  11th - shoulder shrug symmetric  12th - tongue protrusion midline  MOTOR:   normal bulk and tone, full strength in the BUE, BLE  SENSORY:   normal and symmetric to light touch, temperature, vibration  COORDINATION:   finger-nose-finger, fine finger movements normal  REFLEXES:   deep tendon reflexes present and symmetric  GAIT/STATION:   narrow based gait     DIAGNOSTIC DATA (LABS, IMAGING, TESTING) - I reviewed patient records, labs, notes, testing and imaging myself where available.  Lab Results   Component Value Date   WBC 14.3 (H) 12/25/2019   HGB 13.3 12/25/2019   HCT 40.8 12/25/2019   MCV 83.3 12/25/2019   PLT 344 12/25/2019      Component Value Date/Time   NA 138 12/26/2019 0634   K 3.5 12/26/2019 0634   CL 103 12/26/2019 0634   CO2 23 12/26/2019 0634   GLUCOSE 92 12/26/2019 0634   BUN 13 12/26/2019 0634   CREATININE 0.67 12/26/2019 0634   CALCIUM 9.3 12/26/2019 0634   PROT 7.8 12/26/2019 0634   ALBUMIN 4.0 12/26/2019 0634   AST 17 12/26/2019 0634   ALT 22 12/26/2019 0634   ALKPHOS 94 12/26/2019 0634   BILITOT 0.9 12/26/2019 0634   GFRNONAA >60 12/26/2019 0634   GFRAA >60 12/26/2019 0634   Lab Results  Component Value Date   CHOL 185 12/25/2019   HDL 43 12/25/2019   LDLCALC 109 (H) 12/25/2019   TRIG 163 (H) 12/25/2019   CHOLHDL 4.3  12/25/2019   Lab Results  Component Value Date   HGBA1C 4.9 12/25/2019   No results found for: VITAMINB12 Lab Results  Component Value Date   TSH 3.601 12/25/2019    01/09/20 MRI brain / MRA head - Normal intracranial MR angiography. Normal appearance of the arterial and venous structures. - Normal appearance of the brain itself. - Mild arachnoid herniation into the sella. Mild prominence of CSF in the optic nerve sheaths. Both of these can be seen in normal individuals or can be seen with intracranial hypertension.  10/06/20 lumbar puncture 1. Opening pressure of 24 cm H2O is at the upper limits of normal remains within the normal range. 2. Successful L2-L3 lumbar puncture with return of clear CSF.     ASSESSMENT AND PLAN  25 y.o. year old female here with papilledema, headaches, vision changes, starting around May 2021.  Could represent idiopathic intracranial hypertension.  Also has history of migraine headaches.  Dx:  1. IIH (idiopathic intracranial hypertension)     PLAN:  idiopathic intracranial hypertension (pseudotumor cerebri) - start acetazolamide 250mg  twice a day; after 1-2 weeks increase to  500mg  twice a day   MIGRAINE PREVENTION  LIFESTYLE CHANGES -Stop or avoid smoking -Decrease or avoid caffeine / alcohol -Eat and sleep on a regular schedule -Exercise several times per week  MIGRAINE RESCUE  - ibuprofen, tylenol as needed  Meds ordered this encounter  Medications  . acetaZOLAMIDE (DIAMOX) 250 MG tablet    Sig: Take 1-2 tablets (250-500 mg total) by mouth 2 (two) times daily.    Dispense:  120 tablet    Refill:  12   Return in about 6 months (around 04/27/2021).    , MD 10/26/2020, 2:55 PM Certified in Neurology, Neurophysiology and Neuroimaging  [facility] [address], [address] [place], [address] [place] [phone] a

## 2021-01-14 ENCOUNTER — Emergency Department (HOSPITAL_COMMUNITY)
Admission: EM | Admit: 2021-01-14 | Discharge: 2021-01-14 | Disposition: A | Discharge status: Home / Self Care | Payer: BC Managed Care – PPO | Attending: Emergency Medicine | Admitting: Emergency Medicine

## 2021-01-14 ENCOUNTER — Emergency Department (HOSPITAL_COMMUNITY): Payer: BC Managed Care – PPO

## 2021-01-14 ENCOUNTER — Encounter (HOSPITAL_COMMUNITY): Payer: Self-pay | Admitting: Emergency Medicine

## 2021-01-14 ENCOUNTER — Other Ambulatory Visit: Payer: Self-pay

## 2021-01-14 DIAGNOSIS — M25571 Pain in right ankle and joints of right foot: Secondary | ICD-10-CM | POA: Insufficient documentation

## 2021-01-14 DIAGNOSIS — Z7984 Long term (current) use of oral hypoglycemic drugs: Secondary | ICD-10-CM | POA: Insufficient documentation

## 2021-01-14 DIAGNOSIS — X501XXA Overexertion from prolonged static or awkward postures, initial encounter: Secondary | ICD-10-CM | POA: Insufficient documentation

## 2021-01-14 DIAGNOSIS — Y9301 Activity, walking, marching and hiking: Secondary | ICD-10-CM | POA: Diagnosis not present

## 2021-01-14 DIAGNOSIS — S99911A Unspecified injury of right ankle, initial encounter: Secondary | ICD-10-CM | POA: Diagnosis present

## 2021-01-14 DIAGNOSIS — S93401A Sprain of unspecified ligament of right ankle, initial encounter: Secondary | ICD-10-CM | POA: Insufficient documentation

## 2021-01-14 NOTE — Discharge Instructions (Addendum)
You may alternate taking Tylenol and Ibuprofen as needed for pain control. You may take 400-600 mg of ibuprofen every 6 hours and 500-1000 mg of Tylenol every 6 hours. Do not exceed 4000 mg of Tylenol daily as this can lead to liver damage. Also, make sure to take Ibuprofen with meals as it can cause an upset stomach. Do not take other NSAIDs while taking Ibuprofen such as (Aleve, Naprosyn, Aspirin, Celebrex, etc) and do not take more than the prescribed dose as this can lead to ulcers and bleeding in your GI tract. You may use warm and cold compresses to help with your symptoms.   Please follow up with your primary doctor or with the orthopedic doctor within the next 7-10 days for re-evaluation and further treatment of your symptoms.   Please return to the ER sooner if you have any new or worsening symptoms.  

## 2021-01-14 NOTE — ED Triage Notes (Signed)
Patient c/o right ankle pain that stated today after injury Per patient foot twisted while walking down steps and folded under her. Patient states "I felt a crack and have had pain since."

## 2021-01-14 NOTE — ED Provider Notes (Signed)
[facility] EMERGENCY DEPARTMENT Provider Note   CSN: [id number] Arrival date & time: 01/14/21  1635     History Chief Complaint  Patient presents with   Ankle Pain    Tiffany Weber is a 25 y.o. female.  HPI  25 year old female with a history of endometriosis, frequent headaches, who presents to the emergency department today for evaluation after a fall.  States she was walking and she missed a step when going down the stairs and twisted her ankle.  She denies any other injuries but does have constant pain to the ankle.  The pain started suddenly and is worse with movement or palpation.  It is also worse with weightbearing.  Past Medical History:  Diagnosis Date   Endometriosis    Frequent headaches     Patient Active Problem List   Diagnosis Date Noted   Severe recurrent major depression without psychotic features (HCC) 12/25/2019   Suicidal ideation 12/25/2019    Past Surgical History:  Procedure Laterality Date   oophrectomy Right      OB History     Gravida  0   Para  0   Term  0   Preterm  0   AB  0   Living  0      SAB  0   IAB  0   Ectopic  0   Multiple  0   Live Births  0           Family History  Problem Relation Age of Onset   Hypertension Mother    Hypertension Father    Diabetes Father    Diabetes Maternal Grandmother    Stroke Maternal Grandmother    Diabetes Maternal Grandfather    Stroke Maternal Grandfather    Diabetes Paternal Grandmother    Stroke Paternal Grandmother    Diabetes Paternal Grandfather    Stroke Paternal Grandfather    Aneurysm Other     Social History   Tobacco Use   Smoking status: Never   Smokeless tobacco: Never  Vaping Use   Vaping Use: Never used  Substance Use Topics   Alcohol use: Never   Drug use: Never    Home Medications Prior to Admission medications   Medication Sig Start Date End Date Taking? Authorizing Provider  acetaZOLAMIDE (DIAMOX) 250 MG tablet Take 1-2 tablets  (250-500 mg total) by mouth 2 (two) times daily. 10/26/20   Penumalli, Glenford Bayley, MD  FLUoxetine (PROZAC) 40 MG capsule Take 1 capsule (40 mg total) by mouth daily. 12/28/19 01/27/20  Dagar, Geralynn Rile, MD  metFORMIN (GLUCOPHAGE) 1000 MG tablet Take 1,000 mg by mouth 2 (two) times daily with a meal.    [provider]    Allergies    Patient has no known allergies.  Review of Systems   Review of Systems  Constitutional:  Negative for fever.  Musculoskeletal:        Ankle pain  Skin:  Negative for wound.   Physical Exam Updated Vital Signs BP 134/78 (BP Location: Right Arm)   Pulse 99   Temp 98.4 F (36.9 C) (Oral)   Resp 18   Ht 5\' 3"  (1.6 m)   Wt 125.6 kg   LMP 01/02/2021   SpO2 100%   BMI 49.07 kg/m   Physical Exam Vitals and nursing note reviewed.  Constitutional:      General: She is not in acute distress.    Appearance: She is well-developed.  HENT:     Head: Normocephalic and  atraumatic.  Eyes:     Conjunctiva/sclera: Conjunctivae normal.  Cardiovascular:     Rate and Rhythm: Normal rate.  Pulmonary:     Effort: Pulmonary effort is normal.  Musculoskeletal:     Cervical back: Neck supple.     Comments: TTP to the right medial malleolus. No ttp in the foot. nvi  Skin:    General: Skin is warm and dry.  Neurological:     Mental Status: She is alert.    ED Results / Procedures / Treatments   Labs (all labs ordered are listed, but only abnormal results are displayed) Labs Reviewed - No data to display  EKG None  Radiology DG Ankle Complete Right  Result Date: 01/14/2021 CLINICAL DATA:  injury EXAM: RIGHT ANKLE - COMPLETE 3+ VIEW COMPARISON:  None. FINDINGS: No acute fracture or dislocation. Joint spaces and alignment are maintained. No area of erosion or osseous destruction. No unexpected radiopaque foreign body. Soft tissues are unremarkable. IMPRESSION: No acute fracture or dislocation. Electronically Signed   By: Meda Klinefelter M.D.   On:  01/14/2021 18:12    Procedures Procedures   SPLINT APPLICATION Date/Time: 6:35 PM Authorized by: Karrie Meres Consent: Verbal consent obtained. Risks and benefits: risks, benefits and alternatives were discussed Consent given by: patient Splint applied by: nurse Location details: rle Splint type: ankle air cast Supplies used: ankle air cast Post-procedure: The splinted body part was neurovascularly unchanged following the procedure. Patient tolerance: Patient tolerated the procedure well with no immediate complications.    Medications Ordered in ED Medications - No data to display  ED Course  I have reviewed the triage vital signs and the nursing notes.  Pertinent labs & imaging results that were available during my care of the patient were reviewed by me and considered in my medical decision making (see chart for details).    MDM Rules/Calculators/A&P                          Patient presenting with ankle pain after twisting ankle prior to arrival.  Vital signs stable and patient nontoxic-appearing.  X-ray of right ankle negative for acute fracture abnormality.  A splint was applied and crutches given.  OrthO follow-up given and patient advised to follow-up with either PCP or orthopedics in 1 week for reevaluation.  Advised Tylenol, ibuprofen, and rice protocol for pain.  Advised to return to the ER for any new or worsening symptoms in the meantime.  All questions were answered and patient understands plan and reasons to return.   Final Clinical Impression(s) / ED Diagnoses Final diagnoses:  Sprain of right ankle, unspecified ligament, initial encounter    Rx / DC Orders ED Discharge Orders     None        Rayne Du 01/14/21 1836    Pollyann Savoy, MD 01/14/21 2253

## 2021-06-06 NOTE — Progress Notes (Deleted)
New Patient Note  RE: Tiffany Weber MRN: [id number] DOB: March 05, 1996 Date of Office Visit: 06/07/2021  Consult requested by: [facility] Primary care provider: [facility]  Chief Complaint: No chief complaint on file.  History of Present Illness: I had the pleasure of seeing [facility] for initial evaluation at the [facility] [place] on 06/06/2021. She is a 26 y.o. female, who is referred here by [facility] Noss, [place] for the evaluation of ***.  ***  Assessment and Plan: [name] is a 26 y.o. female with: No problem-specific Assessment & Plan notes found for this encounter.  No follow-ups on file.  No orders of the defined types were placed in this encounter.  Lab Orders  No laboratory test(s) ordered today    Other allergy screening: Asthma: {Blank single:19197::"yes","no"} Rhino conjunctivitis: {Blank single:19197::"yes","no"} Food allergy: {Blank single:19197::"yes","no"} Medication allergy: {Blank single:19197::"yes","no"} Hymenoptera allergy: {Blank single:19197::"yes","no"} Urticaria: {Blank single:19197::"yes","no"} Eczema:{Blank single:19197::"yes","no"} History of recurrent infections suggestive of immunodeficency: {Blank single:19197::"yes","no"}  Diagnostics: Spirometry:  Tracings reviewed. Her effort: {Blank single:19197::"Good reproducible efforts.","It was hard to get consistent efforts and there is a question as to whether this reflects a maximal maneuver.","Poor effort, data can not be interpreted."} FVC: ***L FEV1: ***L, ***% predicted FEV1/FVC ratio: ***% Interpretation: {Blank single:19197::"Spirometry consistent with mild obstructive disease","Spirometry consistent with moderate obstructive disease","Spirometry consistent with severe obstructive disease","Spirometry consistent with possible restrictive disease","Spirometry consistent with mixed obstructive and restrictive disease","Spirometry uninterpretable  due to technique","Spirometry consistent with normal pattern","No overt abnormalities noted given today's efforts"}.  Please see scanned spirometry results for details.  Skin Testing: {Blank single:19197::"Select foods","Environmental allergy panel","Environmental allergy panel and select foods","Food allergy panel","None","Deferred due to recent antihistamines use"}. *** Results discussed with patient/family.   Past Medical History: Patient Active Problem List   Diagnosis Date Noted   Severe recurrent major depression without psychotic features ([place]) 12/25/2019   Suicidal ideation 12/25/2019   Past Medical History:  Diagnosis Date   Endometriosis    Frequent headaches    Past Surgical History: Past Surgical History:  Procedure Laterality Date   oophrectomy Right    Medication List:  Current Outpatient Medications  Medication Sig Dispense Refill   acetaZOLAMIDE (DIAMOX) 250 MG tablet Take 1-2 tablets (250-500 mg total) by mouth 2 (two) times daily. 120 tablet 12   FLUoxetine (PROZAC) 40 MG capsule Take 1 capsule (40 mg total) by mouth daily. 30 capsule 3   metFORMIN (GLUCOPHAGE) 1000 MG tablet Take 1,000 mg by mouth 2 (two) times daily with a meal.     No current facility-administered medications for this visit.   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 Smoking: *** Occupation: ***  Environmental HistoryFreight forwarder in the house: Estate agent in the family room: {Blank single:19197::"yes","no"} Carpet in the bedroom: {Blank single:19197::"yes","no"} Heating: {Blank single:19197::"electric","gas","heat pump"} Cooling: {Blank single:19197::"central","window","heat pump"} Pet: {Blank single:19197::"yes ***","no"}  Family History: Family History  Problem Relation Age of Onset   Hypertension Mother    Hypertension Father    Diabetes Father    Diabetes Maternal Grandmother    Stroke Maternal Grandmother    Diabetes Maternal Grandfather    Stroke Maternal Grandfather    Diabetes Paternal Grandmother    Stroke Paternal Grandmother    Diabetes Paternal Grandfather    Stroke Paternal Grandfather    Aneurysm Other    Problem                               Relation Asthma                                   *** Eczema                                *** Food allergy                          *** Allergic rhino conjunctivitis     ***  Review of Systems  Constitutional:  Negative for appetite change, chills, fever and unexpected weight change.  HENT:  Negative for congestion and rhinorrhea.   Eyes:  Negative for itching.  Respiratory:  Negative for cough, chest tightness, shortness of breath and wheezing.   Cardiovascular:  Negative for chest pain.  Gastrointestinal:  Negative for abdominal pain.  Genitourinary:  Negative for difficulty urinating.  Skin:  Negative for rash.  Neurological:  Negative for headaches.   Objective: There were no vitals taken for this visit. There is no height or weight on file to calculate BMI. Physical Exam Vitals and nursing note reviewed.  Constitutional:       Appearance: Normal appearance. She is well-developed.  HENT:     Head: Normocephalic and atraumatic.     Right Ear: Tympanic membrane and external ear normal.     Left Ear: Tympanic membrane and external ear normal.     Nose: Nose normal.     Mouth/Throat:     Mouth: Mucous membranes are moist.     Pharynx: Oropharynx is clear.  Eyes:     Conjunctiva/sclera: Conjunctivae normal.  Cardiovascular:     Rate and Rhythm: Normal rate and regular rhythm.     Heart sounds: Normal heart sounds. No murmur heard.   No friction rub. No gallop.  Pulmonary:     Effort: Pulmonary effort is normal.     Breath sounds: Normal breath sounds. No wheezing, rhonchi or rales.  Musculoskeletal:     Cervical back: Neck supple.  Skin:    General: Skin is warm.     Findings: No rash.  Neurological:     Mental Status: She is alert and oriented to person, place, and time.  Psychiatric:        Behavior: Behavior normal.  The plan was reviewed with the patient/family, and all questions/concerned were addressed.  It was my  pleasure to see Laren today and participate in her care. Please feel free to contact me with any questions or concerns.  Sincerely,  Rexene Alberts, DO Allergy & Immunology  [facility] office: [place] office: [phone]

## 2021-06-07 ENCOUNTER — Ambulatory Visit: Payer: BC Managed Care – PPO | Admitting: Allergy

## 2021-08-16 ENCOUNTER — Emergency Department (HOSPITAL_COMMUNITY)
Admission: EM | Admit: 2021-08-16 | Discharge: 2021-08-16 | Disposition: A | Discharge status: Home / Self Care | Payer: BC Managed Care – PPO | Attending: Student | Admitting: Student

## 2021-08-16 ENCOUNTER — Other Ambulatory Visit: Payer: Self-pay

## 2021-08-16 ENCOUNTER — Emergency Department (HOSPITAL_COMMUNITY): Payer: BC Managed Care – PPO

## 2021-08-16 ENCOUNTER — Encounter (HOSPITAL_COMMUNITY): Payer: Self-pay

## 2021-08-16 DIAGNOSIS — N83202 Unspecified ovarian cyst, left side: Secondary | ICD-10-CM | POA: Diagnosis not present

## 2021-08-16 DIAGNOSIS — D72829 Elevated white blood cell count, unspecified: Secondary | ICD-10-CM | POA: Insufficient documentation

## 2021-08-16 DIAGNOSIS — R1032 Left lower quadrant pain: Secondary | ICD-10-CM | POA: Diagnosis present

## 2021-08-16 DIAGNOSIS — N83209 Unspecified ovarian cyst, unspecified side: Secondary | ICD-10-CM

## 2021-08-16 LAB — CBC WITH DIFFERENTIAL/PLATELET
Abs Immature Granulocytes: 0.02 10*3/uL (ref 0.00–0.07)
Basophils Absolute: 0 10*3/uL (ref 0.0–0.1)
Basophils Relative: 0 %
Eosinophils Absolute: 0.1 10*3/uL (ref 0.0–0.5)
Eosinophils Relative: 1 %
HCT: 38.8 % (ref 36.0–46.0)
Hemoglobin: 12.6 g/dL (ref 12.0–15.0)
Immature Granulocytes: 0 %
Lymphocytes Relative: 25 %
Lymphs Abs: 2.9 10*3/uL (ref 0.7–4.0)
MCH: 27.5 pg (ref 26.0–34.0)
MCHC: 32.5 g/dL (ref 30.0–36.0)
MCV: 84.5 fL (ref 80.0–100.0)
Monocytes Absolute: 0.8 10*3/uL (ref 0.1–1.0)
Monocytes Relative: 7 %
Neutro Abs: 7.8 10*3/uL — ABNORMAL HIGH (ref 1.7–7.7)
Neutrophils Relative %: 67 %
Platelets: 332 10*3/uL (ref 150–400)
RBC: 4.59 MIL/uL (ref 3.87–5.11)
RDW: 14.4 % (ref 11.5–15.5)
WBC: 11.6 10*3/uL — ABNORMAL HIGH (ref 4.0–10.5)
nRBC: 0 % (ref 0.0–0.2)

## 2021-08-16 LAB — URINALYSIS, ROUTINE W REFLEX MICROSCOPIC
Bilirubin Urine: NEGATIVE
Glucose, UA: NEGATIVE mg/dL
Hgb urine dipstick: NEGATIVE
Ketones, ur: NEGATIVE mg/dL
Nitrite: NEGATIVE
Protein, ur: NEGATIVE mg/dL
Specific Gravity, Urine: 1.026 (ref 1.005–1.030)
pH: 5 (ref 5.0–8.0)

## 2021-08-16 LAB — COMPREHENSIVE METABOLIC PANEL
ALT: 15 U/L (ref 0–44)
AST: 15 U/L (ref 15–41)
Albumin: 3.8 g/dL (ref 3.5–5.0)
Alkaline Phosphatase: 61 U/L (ref 38–126)
Anion gap: 6 (ref 5–15)
BUN: 12 mg/dL (ref 6–20)
CO2: 19 mmol/L — ABNORMAL LOW (ref 22–32)
Calcium: 9.1 mg/dL (ref 8.9–10.3)
Chloride: 110 mmol/L (ref 98–111)
Creatinine, Ser: 0.68 mg/dL (ref 0.44–1.00)
GFR, Estimated: >60 mL/min (ref >60)
Glucose, Bld: 99 mg/dL (ref 70–99)
Potassium: 3.7 mmol/L (ref 3.5–5.1)
Sodium: 135 mmol/L (ref 135–145)
Total Bilirubin: 0.4 mg/dL (ref 0.3–1.2)
Total Protein: 7.5 g/dL (ref 6.5–8.1)

## 2021-08-16 LAB — I-STAT BETA HCG BLOOD, ED (MC, WL, AP ONLY): I-stat hCG, quantitative: <5 m[IU]/mL (ref <5)

## 2021-08-16 LAB — LIPASE, BLOOD: Lipase: 33 U/L (ref 11–51)

## 2021-08-16 MED ORDER — NAPROXEN 375 MG PO TABS: 375.0000 mg | ORAL_TABLET | Freq: Two times a day (BID) | ORAL | 0 refills | Status: DC

## 2021-08-16 NOTE — ED Triage Notes (Signed)
Pt arrived via POV c/o LLQ pain and left ovarian pain. Pt reports not having a right ovary, and she is currently past 8 days of ovulating and her and her fiance are trying to conceive. Unsure if pregnant at this time. Pt denies any bleeding at this time but believes a small piece of tissue passed earlier today while micturating.  ?

## 2021-08-17 NOTE — ED Provider Notes (Signed)
?[facility] EMERGENCY DEPARTMENT ?Provider Note ? ?CSN: [id number] ?Arrival date & time: 08/16/21 1910 ? ?Chief Complaint(s) ?Abdominal Pain (LLQ) ? ?HPI ?Tiffany Weber is a 26 y.o. female with PMH endometriosis, PCOS, right-sided oophorectomy who presents emergency department for evaluation of left lower quadrant pain.  Patient states that pain worsened acutely today and has since improved from this morning but is still persistent.  She states she is on Clomid for fertility and took this medication last approximately 2 weeks ago.  She denies dysuria, increased frequency, headache, fever, chest pain, shortness of breath or vaginal bleeding. ? ? ?Abdominal Pain ? ?Past Medical History ?Past Medical History:  ?Diagnosis Date  ? Endometriosis   ? Frequent headaches   ? ?Patient Active Problem List  ? Diagnosis Date Noted  ? Severe recurrent major depression without psychotic features (HCC) 12/25/2019  ? Suicidal ideation 12/25/2019  ? ?Home Medication(s) ?Prior to Admission medications   ?Medication Sig Start Date End Date Taking? Authorizing Provider  ?Docusate Sodium (DSS) 100 MG CAPS Take 1 capsule by mouth daily. 12/09/14  Yes [provider]  ?FLUoxetine (PROZAC) 40 MG capsule Take 1 capsule by mouth daily. 04/11/21  Yes [provider]  ?naproxen (NAPROSYN) 375 MG tablet Take 1 tablet (375 mg total) by mouth 2 (two) times daily. 08/16/21  Yes Jakeya Gherardi, MD  ?norethindrone (MICRONOR) 0.35 MG tablet Take 1 tablet by mouth daily. 08/09/16  Yes [provider]  ?acetaZOLAMIDE (DIAMOX) 250 MG tablet Take 1-2 tablets (250-500 mg total) by mouth 2 (two) times daily. 10/26/20   Penumalli, Glenford BayleyVikram R, MD  ?CLOMID 50 MG tablet Take by mouth. 07/11/21   [provider]  ?FLUoxetine (PROZAC) 40 MG capsule Take 1 capsule (40 mg total) by mouth daily. 12/28/19 01/27/20  Dagar, Geralynn RileAnjali, MD  ?levothyroxine (SYNTHROID) 25 MCG tablet Take 25 mcg by mouth daily. 08/06/21   [provider]   ?metFORMIN (GLUCOPHAGE) 1000 MG tablet Take 1,000 mg by mouth 2 (two) times daily with a meal.    [provider]  ?metFORMIN (GLUCOPHAGE-XR) 500 MG 24 hr tablet Take 500 mg by mouth every morning. 07/11/21   [provider]  ?progesterone (PROMETRIUM) 200 MG capsule Take by mouth. 05/16/21   [provider]  ?                                                                                                                                  ?Past Surgical History ?Past Surgical History:  ?Procedure Laterality Date  ? oophrectomy Right   ? ?Family History ?Family History  ?Problem Relation Age of Onset  ? Hypertension Mother   ? Hypertension Father   ? Diabetes Father   ? Diabetes Maternal Grandmother   ? Stroke Maternal Grandmother   ? Diabetes Maternal Grandfather   ? Stroke Maternal Grandfather   ? Diabetes Paternal Grandmother   ? Stroke Paternal Grandmother   ? Diabetes Paternal  Grandfather   ? Stroke Paternal Grandfather   ? Aneurysm Other   ? ? ?Social History ?Social History  ? ?Tobacco Use  ? Smoking status: Never  ? Smokeless tobacco: Never  ?Vaping Use  ? Vaping Use: Never used  ?Substance Use Topics  ? Alcohol use: Never  ? Drug use: Never  ? ?Allergies ?Patient has no known allergies. ? ?Review of Systems ?Review of Systems  ?Gastrointestinal:  Positive for abdominal pain.  ?Genitourinary:  Positive for pelvic pain.  ? ?Physical Exam ?Vital Signs  ?I have reviewed the triage vital signs ?BP 134/85   Pulse 93   Temp 99.4 ?F (37.4 ?C) (Oral)   Resp 17   Ht 5\' 3"  (1.6 m)   Wt 126 kg   SpO2 96%   BMI 49.21 kg/m?  ? ?Physical Exam ?Vitals and nursing note reviewed.  ?Constitutional:   ?   General: She is not in acute distress. ?   Appearance: She is well-developed.  ?HENT:  ?   Head: Normocephalic and atraumatic.  ?Eyes:  ?   Conjunctiva/sclera: Conjunctivae normal.  ?Cardiovascular:  ?   Rate and Rhythm: Normal rate and regular rhythm.  ?   Heart sounds: No murmur  heard. ?Pulmonary:  ?   Effort: Pulmonary effort is normal. No respiratory distress.  ?   Breath sounds: Normal breath sounds.  ?Abdominal:  ?   Palpations: Abdomen is soft.  ?   Tenderness: There is abdominal tenderness in the left lower quadrant.  ?Musculoskeletal:     ?   General: No swelling.  ?   Cervical back: Neck supple.  ?Skin: ?   General: Skin is warm and dry.  ?   Capillary Refill: Capillary refill takes less than 2 seconds.  ?Neurological:  ?   Mental Status: She is alert.  ?Psychiatric:     ?   Mood and Affect: Mood normal.  ? ? ?ED Results and Treatments ?Labs ?(all labs ordered are listed, but only abnormal results are displayed) ?Labs Reviewed  ?COMPREHENSIVE METABOLIC PANEL - Abnormal; Notable for the following components:  ?    Result Value  ? CO2 19 (*)   ? All other components within normal limits  ?CBC WITH DIFFERENTIAL/PLATELET - Abnormal; Notable for the following components:  ? WBC 11.6 (*)   ? Neutro Abs 7.8 (*)   ? All other components within normal limits  ?URINALYSIS, ROUTINE W REFLEX MICROSCOPIC - Abnormal; Notable for the following components:  ? APPearance HAZY (*)   ? Leukocytes,Ua TRACE (*)   ? Bacteria, UA RARE (*)   ? All other components within normal limits  ?LIPASE, BLOOD  ?I-STAT BETA HCG BLOOD, ED ([facility], [facility], [facility] ONLY)  ?                                                                                                                       ? ?Radiology ? PELVIC COMPLETE W TRANSVAGINAL AND TORSION R/O ? ?Result Date: 08/16/2021 ?CLINICAL  DATA:  Initial evaluation for acute left lower quadrant pain. History of prior right oophorectomy. EXAM: TRANSABDOMINAL AND TRANSVAGINAL ULTRASOUND OF PELVIS DOPPLER ULTRASOUND OF OVARIES TECHNIQUE: Both transabdominal and transvaginal ultrasound examinations of the pelvis were performed. Transabdominal technique was performed for global imaging of the pelvis including uterus, ovaries, adnexal regions, and pelvic cul-de-sac. It was necessary to  proceed with endovaginal exam following the transabdominal exam to visualize the endometrium and ovaries. Color and duplex Doppler ultrasound was utilized to evaluate blood flow to the ovaries. COMPARISON:  Prior ultrasound from 05/20/2019 FINDINGS: Uterus Measurements: 7.7 x 3.3 x 4.5 cm = volume: 59.1 mL. No fibroids or other mass visualized. Endometrium Thickness: 11.4 mm.  No focal abnormality visualized. Right ovary Not visualized, reportedly surgically absent. No right-sided adnexal mass. Left ovary Measurements: 10.6 x 8.1 x 7.9 cm = volume: 355.6 mL. Enlarged hypoechoic cystic mass with internal lace-like architecture measuring 6.5 x 5.9 x 6.4 cm seen arising from the left ovary (image 77). An additional similar hypoechoic complex lesion measuring 5.8 x 3.0 x 5.5 cm is seen as well (image 79). These are favored to be separate lesions, although a single large complex multiloculated cystic mass is difficult to exclude. Intervening curvilinear band of soft tissue favored to reflect ovarian parenchyma, although a thickened internal septation with vascularity is not excluded (image 72). Few additional smaller cystic components noted as well. No visible solid nodularity. Pulsed Doppler evaluation of the left ovary demonstrates normal low-resistance arterial and venous waveforms. Other findings Moderate volume free fluid within the pelvis. IMPRESSION: 1. Multiple enlarged complex cystic masses arising from the left ovary, largest of which measures 6.5 cm as above. Findings are favored to reflect large hemorrhagic cysts. Possible single multiloculated complex cystic mass or cystic ovarian neoplasm is not excluded. Short interval follow-up ultrasound in 6-12 weeks recommended for further evaluation. Additionally, further assessment with dedicated pelvic MRI, with and without contrast, may be helpful for further evaluation as warranted. 2. Associated moderate volume free fluid within the pelvis. 3. No evidence for  left ovarian torsion. 4. Prior right oophorectomy. 5. Normal sonographic appearance of the uterus and endometrium. Electronically Signed   By: Rise Mu M.D.   On: 08/16/2021 23:20   ? ?Pertinent labs & imaging results tha

## 2022-01-19 ENCOUNTER — Encounter (HOSPITAL_COMMUNITY): Payer: Self-pay | Admitting: Emergency Medicine

## 2022-01-19 ENCOUNTER — Other Ambulatory Visit: Payer: Self-pay

## 2022-01-19 ENCOUNTER — Emergency Department (HOSPITAL_COMMUNITY): Payer: BC Managed Care – PPO

## 2022-01-19 ENCOUNTER — Emergency Department (HOSPITAL_COMMUNITY)
Admission: EM | Admit: 2022-01-19 | Discharge: 2022-01-19 | Disposition: A | Discharge status: Other Acute Inpatient Hospital | Payer: BC Managed Care – PPO | Attending: Emergency Medicine | Admitting: Emergency Medicine

## 2022-01-19 DIAGNOSIS — T8140XA Infection following a procedure, unspecified, initial encounter: Secondary | ICD-10-CM

## 2022-01-19 DIAGNOSIS — Y658 Other specified misadventures during surgical and medical care: Secondary | ICD-10-CM | POA: Diagnosis not present

## 2022-01-19 DIAGNOSIS — T8141XA Infection following a procedure, superficial incisional surgical site, initial encounter: Secondary | ICD-10-CM | POA: Insufficient documentation

## 2022-01-19 DIAGNOSIS — R509 Fever, unspecified: Secondary | ICD-10-CM | POA: Diagnosis present

## 2022-01-19 DIAGNOSIS — R630 Anorexia: Secondary | ICD-10-CM | POA: Insufficient documentation

## 2022-01-19 DIAGNOSIS — R110 Nausea: Secondary | ICD-10-CM | POA: Insufficient documentation

## 2022-01-19 DIAGNOSIS — R300 Dysuria: Secondary | ICD-10-CM | POA: Insufficient documentation

## 2022-01-19 LAB — CBC WITH DIFFERENTIAL/PLATELET
Abs Immature Granulocytes: 0.05 10*3/uL (ref 0.00–0.07)
Basophils Absolute: 0 10*3/uL (ref 0.0–0.1)
Basophils Relative: 0 %
Eosinophils Absolute: 0.2 10*3/uL (ref 0.0–0.5)
Eosinophils Relative: 1 %
HCT: 36.5 % (ref 36.0–46.0)
Hemoglobin: 11.8 g/dL — ABNORMAL LOW (ref 12.0–15.0)
Immature Granulocytes: 0 %
Lymphocytes Relative: 16 %
Lymphs Abs: 1.8 10*3/uL (ref 0.7–4.0)
MCH: 26.6 pg (ref 26.0–34.0)
MCHC: 32.3 g/dL (ref 30.0–36.0)
MCV: 82.2 fL (ref 80.0–100.0)
Monocytes Absolute: 0.7 10*3/uL (ref 0.1–1.0)
Monocytes Relative: 6 %
Neutro Abs: 8.6 10*3/uL — ABNORMAL HIGH (ref 1.7–7.7)
Neutrophils Relative %: 77 %
Platelets: 326 10*3/uL (ref 150–400)
RBC: 4.44 MIL/uL (ref 3.87–5.11)
RDW: 13.6 % (ref 11.5–15.5)
WBC: 11.4 10*3/uL — ABNORMAL HIGH (ref 4.0–10.5)
nRBC: 0 % (ref 0.0–0.2)

## 2022-01-19 LAB — URINALYSIS, ROUTINE W REFLEX MICROSCOPIC
Bacteria, UA: NONE SEEN
Bilirubin Urine: NEGATIVE
Glucose, UA: NEGATIVE mg/dL
Hgb urine dipstick: NEGATIVE
Ketones, ur: NEGATIVE mg/dL
Leukocytes,Ua: NEGATIVE
Nitrite: NEGATIVE
Protein, ur: 30 mg/dL — AB
Specific Gravity, Urine: 1.024 (ref 1.005–1.030)
pH: 6 (ref 5.0–8.0)

## 2022-01-19 LAB — COMPREHENSIVE METABOLIC PANEL
ALT: 19 U/L (ref 0–44)
AST: 14 U/L — ABNORMAL LOW (ref 15–41)
Albumin: 3.4 g/dL — ABNORMAL LOW (ref 3.5–5.0)
Alkaline Phosphatase: 78 U/L (ref 38–126)
Anion gap: 9 (ref 5–15)
BUN: 14 mg/dL (ref 6–20)
CO2: 22 mmol/L (ref 22–32)
Calcium: 8.8 mg/dL — ABNORMAL LOW (ref 8.9–10.3)
Chloride: 107 mmol/L (ref 98–111)
Creatinine, Ser: 0.66 mg/dL (ref 0.44–1.00)
GFR, Estimated: >60 mL/min (ref >60)
Glucose, Bld: 105 mg/dL — ABNORMAL HIGH (ref 70–99)
Potassium: 3.4 mmol/L — ABNORMAL LOW (ref 3.5–5.1)
Sodium: 138 mmol/L (ref 135–145)
Total Bilirubin: 0.9 mg/dL (ref 0.3–1.2)
Total Protein: 7.6 g/dL (ref 6.5–8.1)

## 2022-01-19 LAB — MAGNESIUM: Magnesium: 2.1 mg/dL (ref 1.7–2.4)

## 2022-01-19 LAB — PREGNANCY, URINE: Preg Test, Ur: NEGATIVE

## 2022-01-19 LAB — LIPASE, BLOOD: Lipase: 26 U/L (ref 11–51)

## 2022-01-19 LAB — LACTIC ACID, PLASMA: Lactic Acid, Venous: 0.8 mmol/L (ref 0.5–1.9)

## 2022-01-19 MED ORDER — IOHEXOL 300 MG/ML  SOLN
100.0000 mL | Freq: Once | INTRAMUSCULAR | Status: AC | PRN
  Administered 2022-01-19: 100 mL via INTRAVENOUS

## 2022-01-19 MED ORDER — HYDROMORPHONE HCL 1 MG/ML IJ SOLN
0.5000 mg | Freq: Once | INTRAMUSCULAR | Status: DC
  Filled 2022-01-19: qty 0.5

## 2022-01-19 MED ORDER — ONDANSETRON HCL 4 MG/2ML IJ SOLN
4.0000 mg | Freq: Once | INTRAMUSCULAR | Status: AC
  Administered 2022-01-19: 4 mg via INTRAVENOUS
  Filled 2022-01-19: qty 2

## 2022-01-19 MED ORDER — SODIUM CHLORIDE 0.9 % IV SOLN
1.0000 g | Freq: Once | INTRAVENOUS | Status: AC
  Administered 2022-01-19: 1 g via INTRAVENOUS
  Filled 2022-01-19: qty 10

## 2022-01-19 MED ORDER — METRONIDAZOLE 500 MG/100ML IV SOLN
500.0000 mg | Freq: Two times a day (BID) | INTRAVENOUS | Status: DC
  Administered 2022-01-19: 500 mg via INTRAVENOUS
  Filled 2022-01-19: qty 100

## 2022-01-19 MED ORDER — IOHEXOL 9 MG/ML PO SOLN
ORAL | Status: AC
  Administered 2022-01-19: 500 mL
  Filled 2022-01-19: qty 500

## 2022-01-19 MED ORDER — ACETAMINOPHEN 325 MG PO TABS
650.0000 mg | ORAL_TABLET | Freq: Once | ORAL | Status: AC
  Administered 2022-01-19: 650 mg via ORAL
  Filled 2022-01-19: qty 2

## 2022-01-19 NOTE — ED Notes (Signed)
Patient to CT at this time

## 2022-01-19 NOTE — ED Triage Notes (Signed)
Pt had laproscopic endometrial surgery on 8/24. Pt states pain is excrutiating and had a fever 100.9 this morning at 8am. Pt denies taking any pain meds. Surgery took place in [place] and was advised to come back but due to pain was unable to make it there and was told to come here instead.

## 2022-01-19 NOTE — ED Notes (Signed)
Report called to receiving facility at this time. Patient is to be transferred to room 4846 ([facility]. Report given to receiving nurse and [facility]. States they are still unable to provide transport at this time. Asked that we notify them of ETA at [phone], option 2 when we know patient is leaving and/or our transport is able to take patient.

## 2022-01-19 NOTE — ED Notes (Signed)
Patient asking to wait 10 minutes for pain meds due to anxiety.

## 2022-01-19 NOTE — ED Provider Notes (Signed)
[facility] EMERGENCY DEPARTMENT Provider Note   CSN: [id number] Arrival date & time: 01/19/22  0962     History  Chief Complaint  Patient presents with   Post-op Problem    Tiffany Weber is a 26 y.o. female.  HPI      Tiffany Weber is a 26 y.o. female who presents to the Emergency Department complaining of fever last night and laparoscopic endometrial surgery on 01/11/2022.  Surgery was performed at [facility] by Dr. Hollice Gong.  Patient denies any complications with surgery.  She developed fever last evening of 100.1 orally.  She last took Tylenol at 3 AM this morning.  She has been having gradually worsening pain of her right mid to lower abdomen.  Denies any redness swelling or drainage of the incisions.  She does endorse having some pain with urination and some mild pain of her right flank as well.  She has decreased appetite and some nausea, but denies any vomiting.  She does report having a bowel movement since her surgery and continued flatus.  She denies any chest pain or shortness of breath.   Home Medications Prior to Admission medications   Medication Sig Start Date End Date Taking? Authorizing Provider  acetaZOLAMIDE (DIAMOX) 250 MG tablet Take 1-2 tablets (250-500 mg total) by mouth 2 (two) times daily. 10/26/20   Penumalli, Glenford Bayley, MD  CLOMID 50 MG tablet Take by mouth. 07/11/21   [provider]  Docusate Sodium (DSS) 100 MG CAPS Take 1 capsule by mouth daily. 12/09/14   [provider]  FLUoxetine (PROZAC) 40 MG capsule Take 1 capsule (40 mg total) by mouth daily. 12/28/19 01/27/20  Dagar, Geralynn Rile, MD  FLUoxetine (PROZAC) 40 MG capsule Take 1 capsule by mouth daily. 04/11/21   [provider]  levothyroxine (SYNTHROID) 25 MCG tablet Take 25 mcg by mouth daily. 08/06/21   [provider]  metFORMIN (GLUCOPHAGE) 1000 MG tablet Take 1,000 mg by mouth 2 (two) times daily with a meal.    [provider]  metFORMIN (GLUCOPHAGE-XR)  500 MG 24 hr tablet Take 500 mg by mouth every morning. 07/11/21   [provider]  naproxen (NAPROSYN) 375 MG tablet Take 1 tablet (375 mg total) by mouth 2 (two) times daily. 08/16/21   Kommor, Madison, MD  norethindrone (MICRONOR) 0.35 MG tablet Take 1 tablet by mouth daily. 08/09/16   [provider]  progesterone (PROMETRIUM) 200 MG capsule Take by mouth. 05/16/21   [provider]      Allergies    Patient has no known allergies.    Review of Systems   Review of Systems  Constitutional:  Positive for appetite change and fever. Negative for chills.  HENT:  Negative for sore throat and trouble swallowing.   Respiratory:  Negative for chest tightness and shortness of breath.   Cardiovascular:  Negative for chest pain and leg swelling.  Gastrointestinal:  Positive for abdominal pain and nausea. Negative for blood in stool, constipation, diarrhea and vomiting.  Genitourinary:  Positive for dysuria and flank pain.  Musculoskeletal:  Negative for arthralgias.  Skin:  Negative for color change and rash.  Neurological:  Negative for seizures, weakness and headaches.    Physical Exam Updated Vital Signs BP 124/68   Pulse 98   Temp 99.8 F (37.7 C) (Oral)   Resp 18   Ht 5\' 3"  (1.6 m)   Wt 131.5 kg   LMP 01/05/2022 (Exact Date)   SpO2 99%   BMI  51.37 kg/m  Physical Exam Vitals and nursing note reviewed.  Constitutional:      General: She is not in acute distress.    Appearance: Normal appearance. She is not toxic-appearing.  Cardiovascular:     Rate and Rhythm: Normal rate and regular rhythm.     Pulses: Normal pulses.  Pulmonary:     Effort: Pulmonary effort is normal. No respiratory distress.     Breath sounds: Normal breath sounds.  Abdominal:     Palpations: Abdomen is soft. There is no mass.     Tenderness: There is abdominal tenderness. There is no guarding.     Comments: Diffuse tenderness to palpation of the abdomen.  Incision sites appear to  be healing well.  There is no wound dehiscence or surrounding erythema.  Bowel sounds heard x4.  There is mild guarding at the right lower quadrant.  Musculoskeletal:        General: Normal range of motion.     Right lower leg: No edema.     Left lower leg: No edema.  Skin:    General: Skin is warm.     Capillary Refill: Capillary refill takes less than 2 seconds.     Findings: No rash.  Neurological:     General: No focal deficit present.     Mental Status: She is alert.     Sensory: No sensory deficit.     Motor: No weakness.    ED Results / Procedures / Treatments   Labs (all labs ordered are listed, but only abnormal results are displayed) Labs Reviewed  COMPREHENSIVE METABOLIC PANEL - Abnormal; Notable for the following components:      Result Value   Potassium 3.4 (*)    Glucose, Bld 105 (*)    Calcium 8.8 (*)    Albumin 3.4 (*)    AST 14 (*)    All other components within normal limits  CBC WITH DIFFERENTIAL/PLATELET - Abnormal; Notable for the following components:   WBC 11.4 (*)    Hemoglobin 11.8 (*)    Neutro Abs 8.6 (*)    All other components within normal limits  URINALYSIS, ROUTINE W REFLEX MICROSCOPIC - Abnormal; Notable for the following components:   Color, Urine AMBER (*)    APPearance HAZY (*)    Protein, ur 30 (*)    All other components within normal limits  CULTURE, BLOOD (ROUTINE X 2)  CULTURE, BLOOD (ROUTINE X 2)  URINE CULTURE  LIPASE, BLOOD  LACTIC ACID, PLASMA  PREGNANCY, URINE  MAGNESIUM    EKG None  Radiology CT ABDOMEN PELVIS W CONTRAST  Result Date: 01/19/2022 CLINICAL DATA:  Abdominal pain status post laparoscopic surgery. Fever. EXAM: CT ABDOMEN AND PELVIS WITH CONTRAST TECHNIQUE: Multidetector CT imaging of the abdomen and pelvis was performed using the standard protocol following bolus administration of intravenous contrast. RADIATION DOSE REDUCTION: This exam was performed according to the departmental dose-optimization program  which includes automated exposure control, adjustment of the mA and/or kV according to patient size and/or use of iterative reconstruction technique. CONTRAST:  OMNIPAQUE IOHEXOL 300 MG/ML  SOLN COMPARISON:  June 15, 2020. FINDINGS: Lower chest: No acute abnormality. Hepatobiliary: No gallstones or biliary dilatation is noted. Hepatic steatosis. Pancreas: Unremarkable. No pancreatic ductal dilatation or surrounding inflammatory changes. Spleen: Normal in size without focal abnormality. Adrenals/Urinary Tract: Adrenal glands are unremarkable. Kidneys are normal, without renal calculi, focal lesion, or hydronephrosis. Bladder is unremarkable. Stomach/Bowel: Stomach is within normal limits. Appendix appears normal. No evidence  of bowel wall thickening, distention, or inflammatory changes. Vascular/Lymphatic: No vascular abnormality is noted. Mildly enlarged periaortic lymph nodes are noted which most likely are inflammatory in etiology. Reproductive: Uterus is unremarkable. However, there is the interval development of multiple small fluid collections in the pelvis and in the left adnexal region with surrounding inflammation, most consistent with abscesses. The largest measures 5.1 x 5.5 cm. Other: No definite hernia is noted. Postsurgical changes are seen in the periumbilical region. Musculoskeletal: No acute or significant osseous findings. IMPRESSION: Interval development of multiple small fluid collections in the pelvis, particularly in the left adnexal region, most consistent with abscesses. The largest such abscess measures 5.5 x 5.1 cm. Hepatic steatosis. Mildly enlarged periaortic lymph nodes are noted which most likely are inflammatory in etiology. Surgical changes are seen in the periumbilical region. Electronically Signed   By: Marijo Conception M.D.   On: 01/19/2022 13:22    Procedures Procedures    Medications Ordered in ED Medications  metroNIDAZOLE (FLAGYL) IVPB 500 mg (0 mg Intravenous  Stopped 01/19/22 1636)  HYDROmorphone (DILAUDID) injection 0.5 mg (0.5 mg Intravenous Patient Refused/Not Given 01/19/22 1459)  ondansetron (ZOFRAN) injection 4 mg (4 mg Intravenous Given 01/19/22 1107)  acetaminophen (TYLENOL) tablet 650 mg (650 mg Oral Given 01/19/22 1106)  iohexol (OMNIPAQUE) 9 MG/ML oral solution (500 mLs  Contrast Given 01/19/22 1110)  iohexol (OMNIPAQUE) 300 MG/ML solution 100 mL (100 mLs Intravenous Contrast Given 01/19/22 1250)  cefTRIAXone (ROCEPHIN) 1 g in sodium chloride 0.9 % 100 mL IVPB (0 g Intravenous Stopped 01/19/22 1459)    ED Course/ Medical Decision Making/ A&P                           Medical Decision Making Patient here for evaluation of abdominal pain.  She is 8 days postop from laparoscopic endometrial procedure.  Notes low-grade fever last evening and increasing abdominal pain.  She states endometrial tissue was removed from the left ovary, rectum and bladder.  Last dose of fever reducing medication at 3 AM this morning.  She is having decreased appetite and nausea without vomiting  On exam, she has diffuse tenderness of the abdomen, worse to the right lower abdomen.  Abdomen is soft bowel sounds are present x4.  No active vomiting during ER stay.  Differential diagnosis would include but not limited to postop wound infection, abscess, sepsis, UTI, pyelonephritis.  Amount and/or Complexity of Data Reviewed Labs: ordered.    Details: Labs interpreted by me, no significant leukocytosis.  Chemistries show potassium of 3.4.  Kidney functions unremarkable AST 14 remaining transaminases also unremarkable.  Lipase 26, magnesium 2.1, pregnancy test negative urinalysis without evidence of infection.  Urine and blood cultures pending. Radiology: ordered.    Details: Interval development of multiple small fluid collections within the pelvis particularly in the left adnexal region most consistent with abscesses.  Largest abscess appears to be 5 x 5 cm Discussion of management  or test interpretation with external provider(s): Patient with postop abscesses within the pelvis.  Plan includes to consult with surgeon at [facility].  We will start antibiotics.  Consulted with surgeon at [facility], Dr. Rica Mote who recommends pt be transferred to [facility] and accepting physician is Dr. Lanelle Bal.    Risk OTC drugs. Prescription drug management.           Final Clinical Impression(s) / ED Diagnoses Final diagnoses:  Postoperative infection, unspecified type, initial encounter    Rx /  DC Orders ED Discharge Orders     None         Kem Parkinson, PA-C 01/19/22 1748    Wyvonnia Dusky, MD 01/20/22 [phone]

## 2022-01-21 LAB — URINE CULTURE

## 2022-01-24 LAB — CULTURE, BLOOD (ROUTINE X 2)
Culture: NO GROWTH
Culture: NO GROWTH
Special Requests: ADEQUATE

## 2022-10-16 IMAGING — DX DG ANKLE COMPLETE 3+V*R*
3 series · 3 of 3 positions shown · non-contrast
Comparison: None.

CLINICAL DATA: injury

EXAM:
RIGHT ANKLE - COMPLETE 3+ VIEW

[ankle ap]
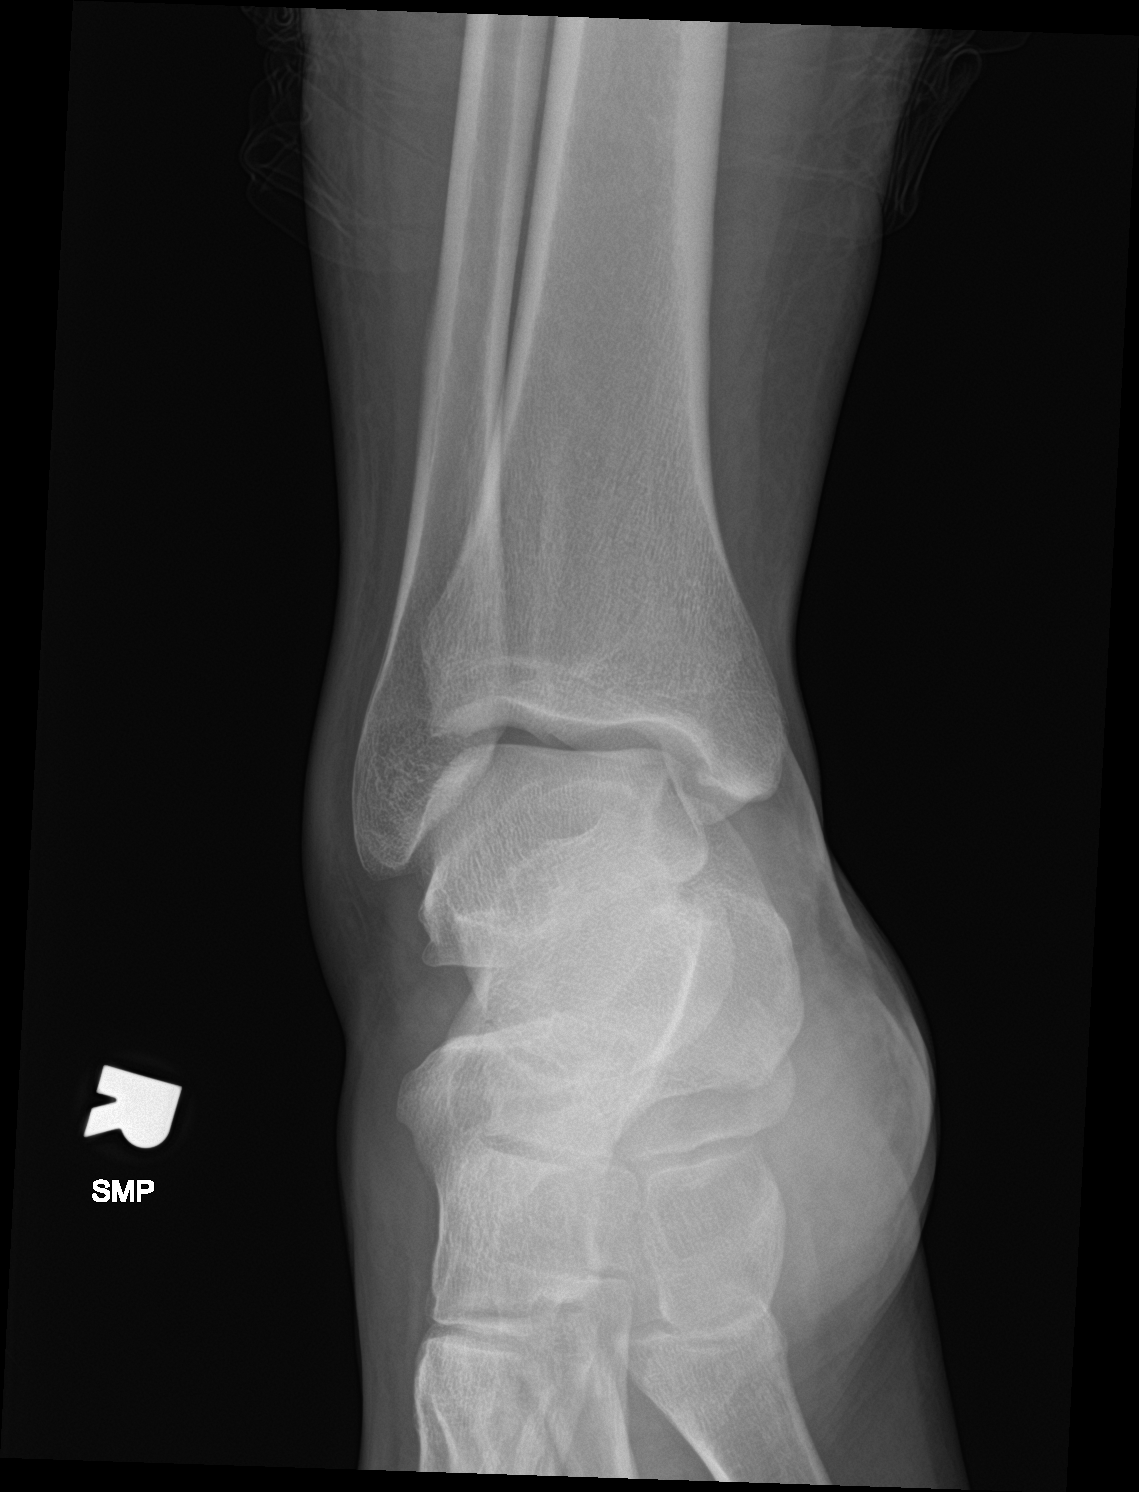

[ankle obl]
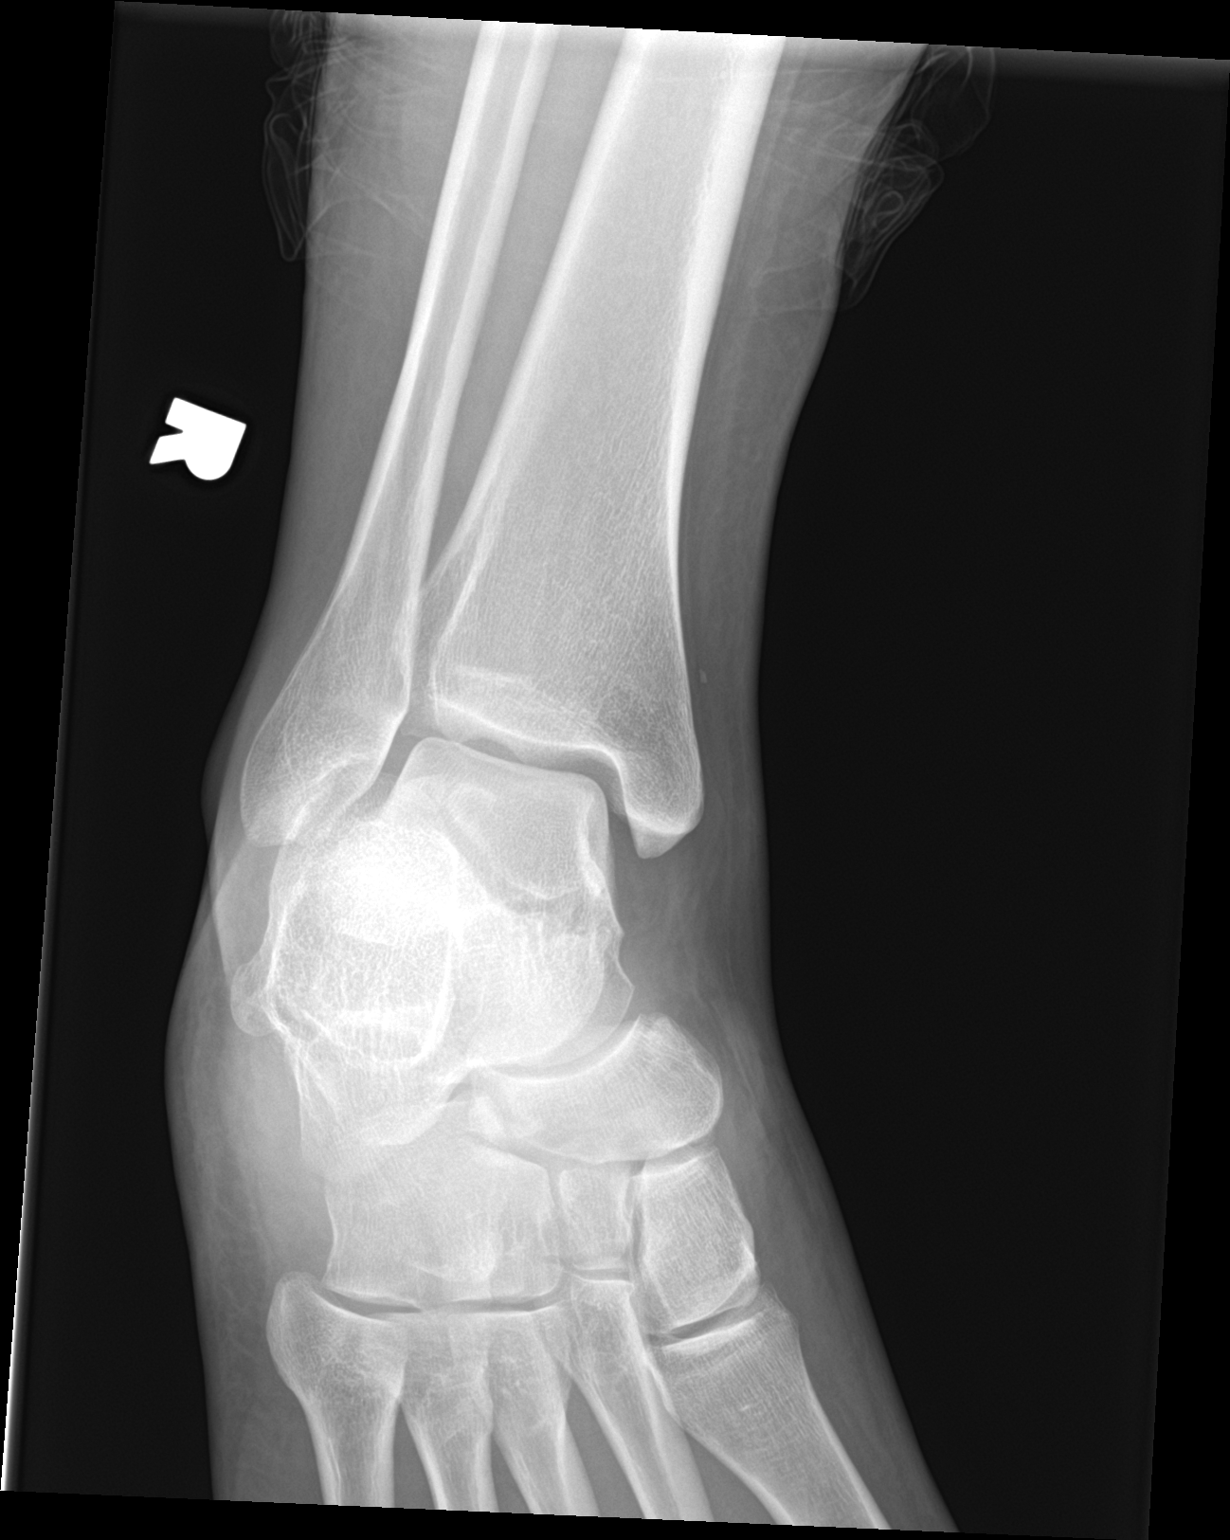

[ankle lat]
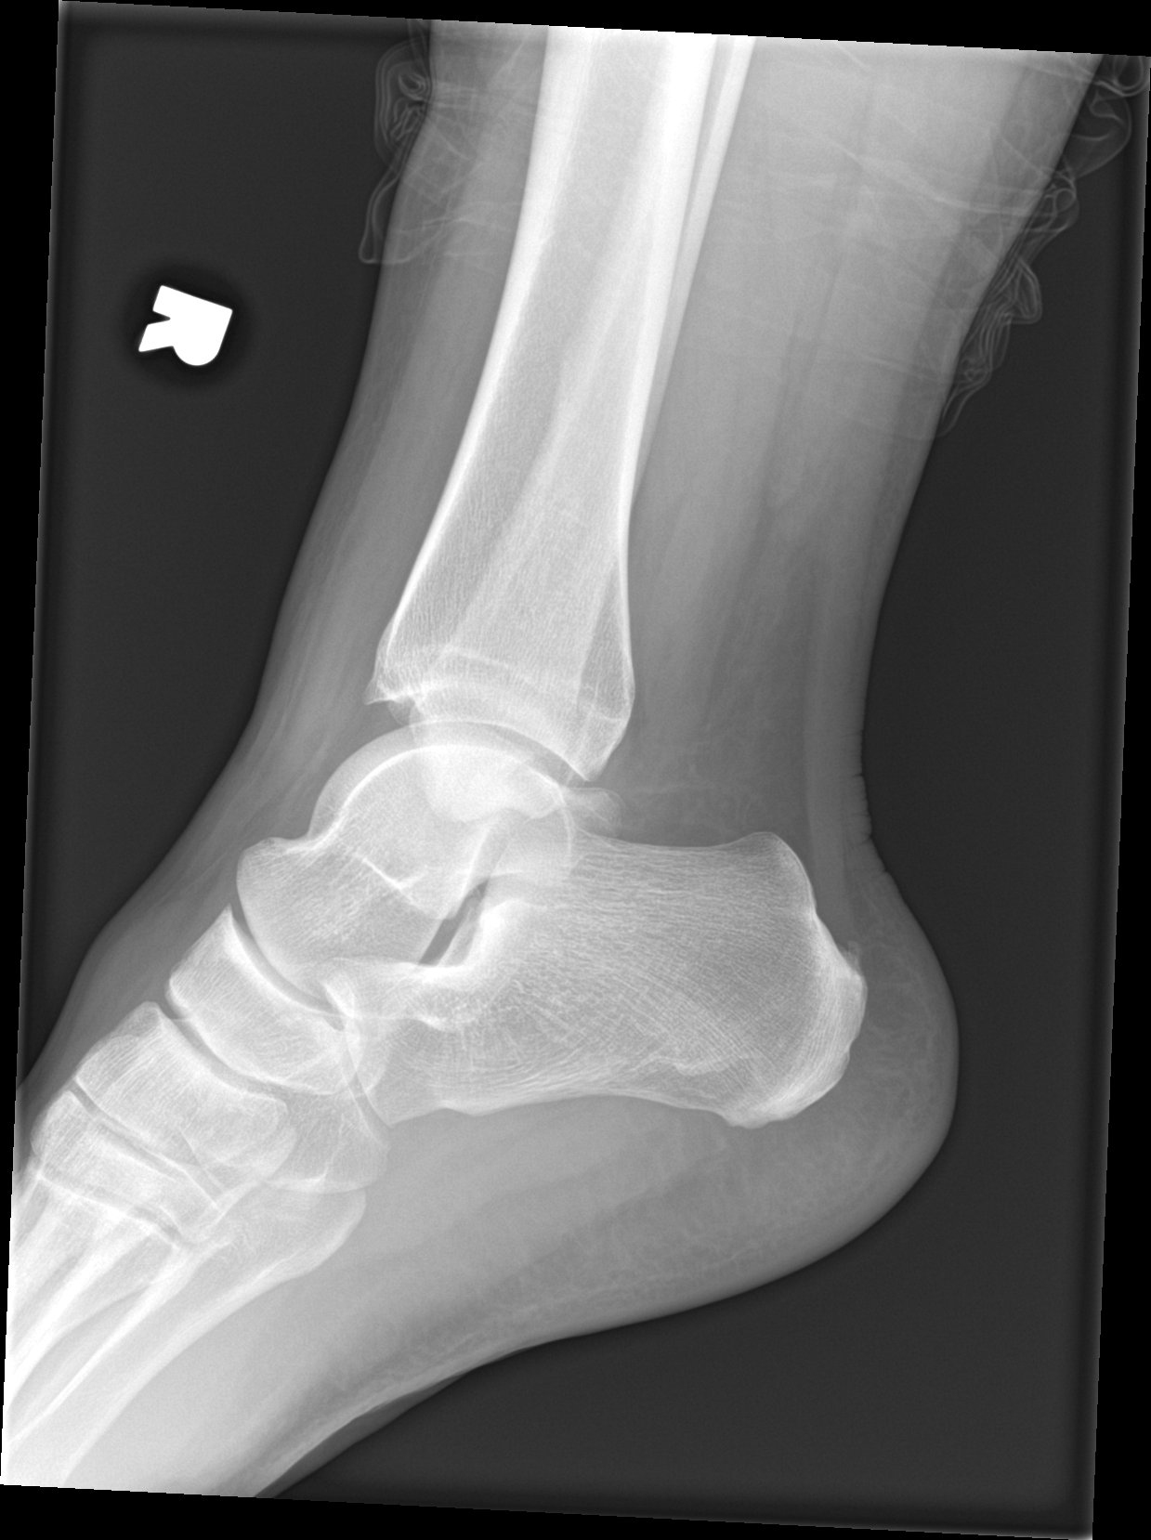

[3 of 3 positions shown; findings below may reference images not displayed]

FINDINGS: No acute fracture or dislocation. Joint spaces and alignment are
maintained. No area of erosion or osseous destruction. No unexpected
radiopaque foreign body. Soft tissues are unremarkable.
IMPRESSION: No acute fracture or dislocation.

## 2023-04-02 ENCOUNTER — Other Ambulatory Visit: Payer: Self-pay | Admitting: Family Medicine

## 2023-04-07 ENCOUNTER — Other Ambulatory Visit: Payer: Self-pay | Admitting: Family Medicine

## 2023-04-08 ENCOUNTER — Other Ambulatory Visit: Payer: Self-pay | Admitting: Family Medicine

## 2023-05-18 IMAGING — US US PELVIS COMPLETE TRANSABD/TRANSVAG W DUPLEX AND/OR DOPPLER
1 series · 13 of 25 positions shown · non-contrast
Comparison: Prior ultrasound from 05/20/2019

CLINICAL DATA: Initial evaluation for acute left lower quadrant
pain. History of prior right oophorectomy.

EXAM:
TRANSABDOMINAL AND TRANSVAGINAL ULTRASOUND OF PELVIS
DOPPLER ULTRASOUND OF OVARIES
TECHNIQUE: Both transabdominal and transvaginal ultrasound examinations of the
pelvis were performed. Transabdominal technique was performed for
global imaging of the pelvis including uterus, ovaries, adnexal
regions, and pelvic cul-de-sac.
It was necessary to proceed with endovaginal exam following the
transabdominal exam to visualize the endometrium and ovaries. Color
and duplex Doppler ultrasound was utilized to evaluate blood flow to
the ovaries.

[Series 1: us pelvic complete w transvaginal and torsion righ · 13 of 89 slices shown]
[im 1/89]
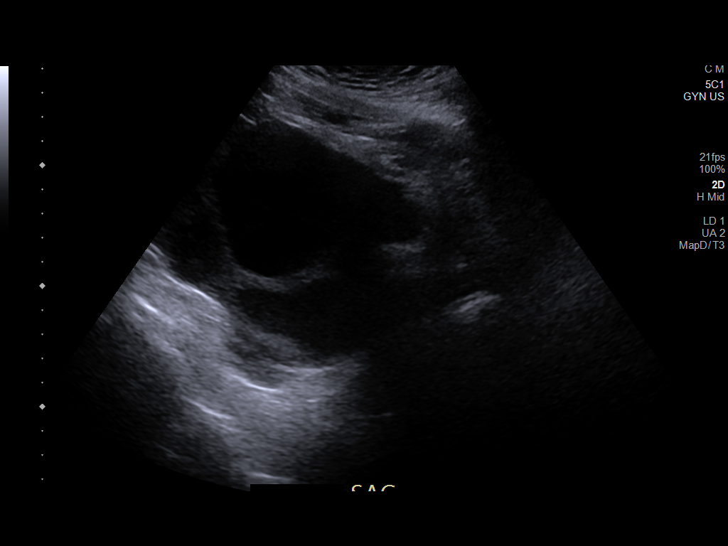
[im 8/89]
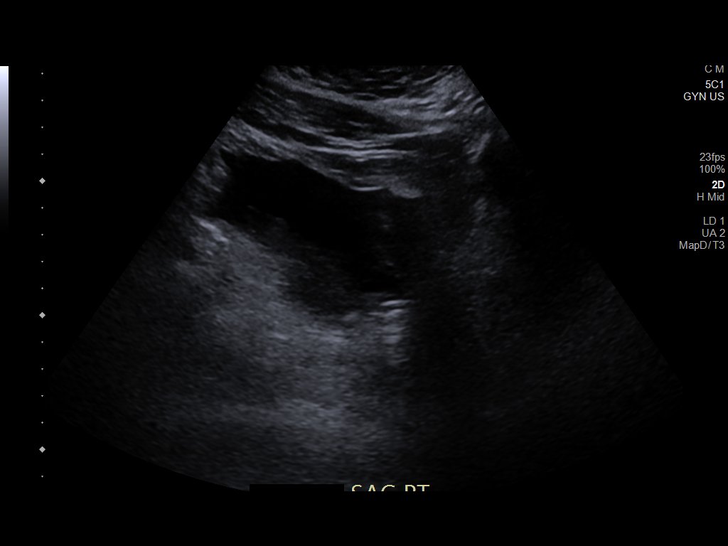
[im 15/89]
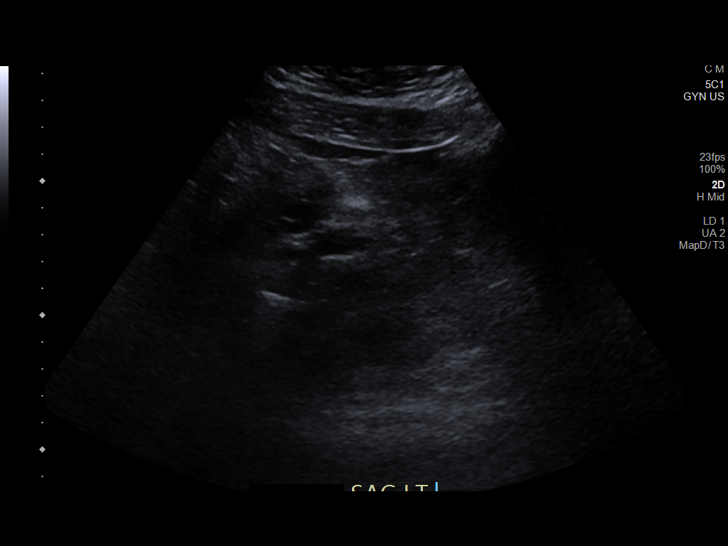
[im 23/89]
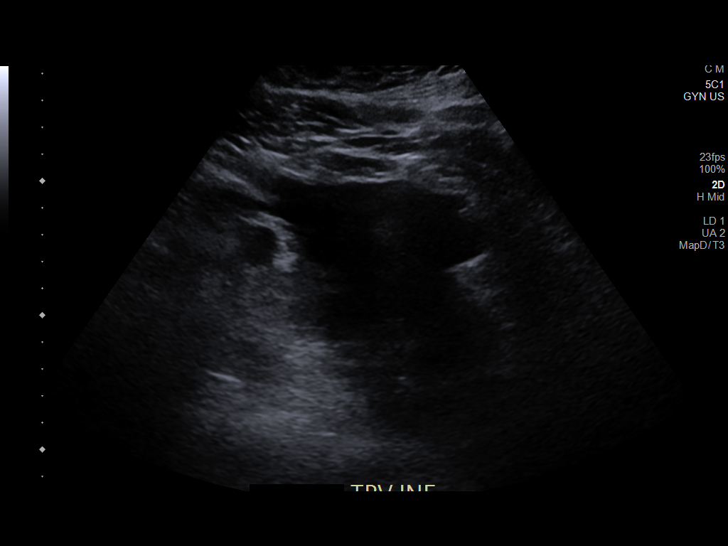
[im 30/89]
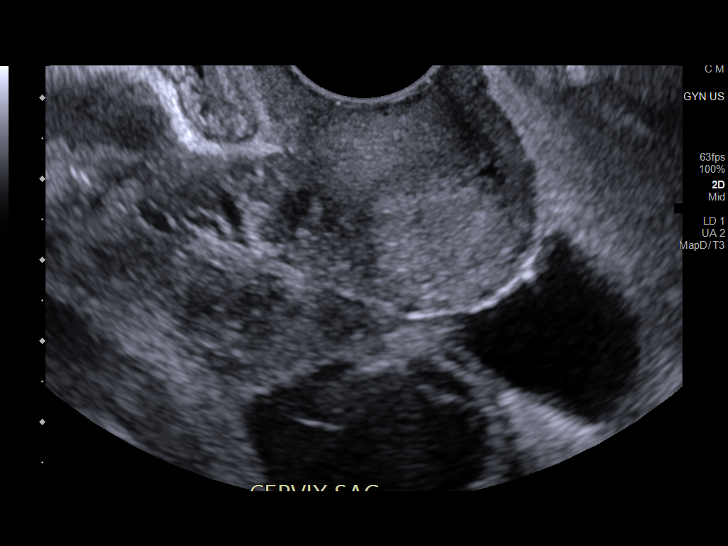
[im 37/89]
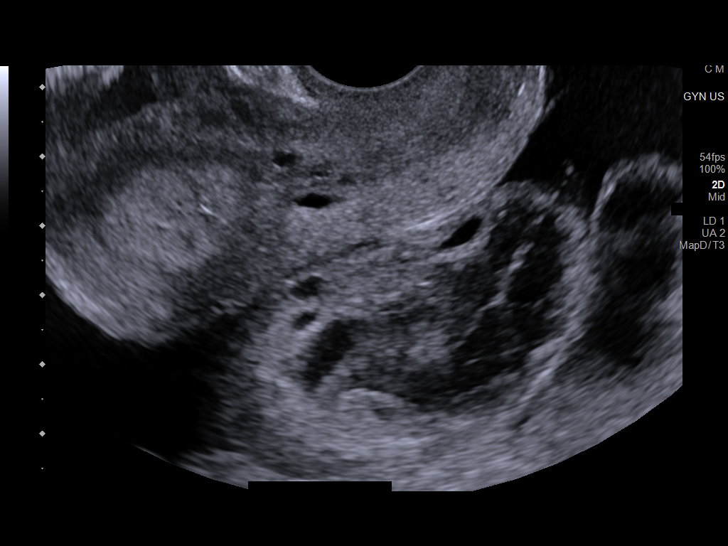
[im 45/89]
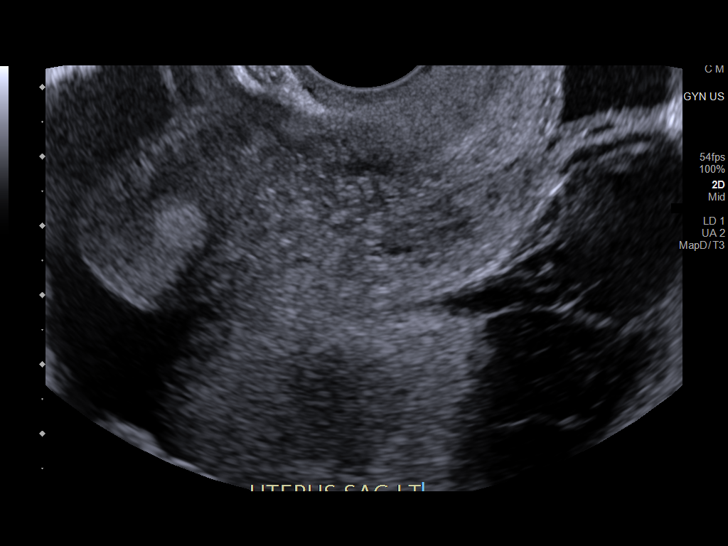
[im 52/89]
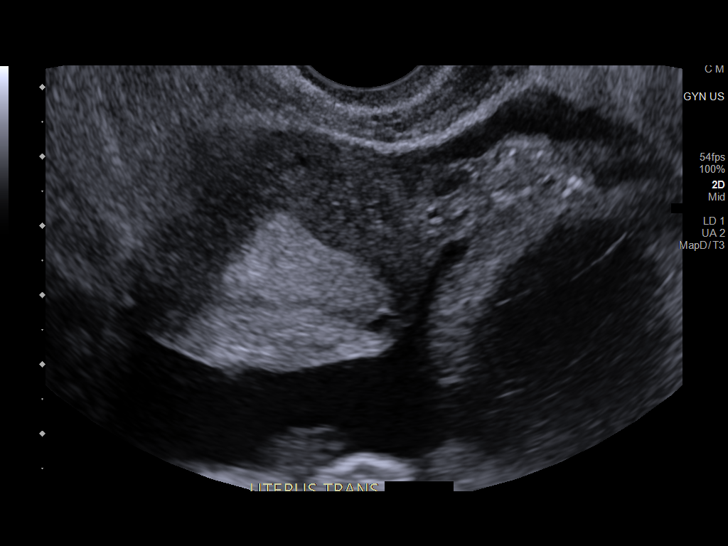
[im 59/89]
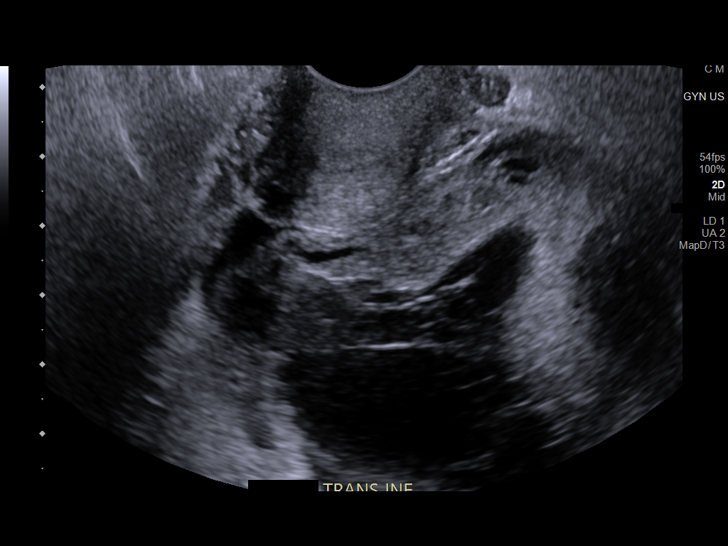
[im 67/89]
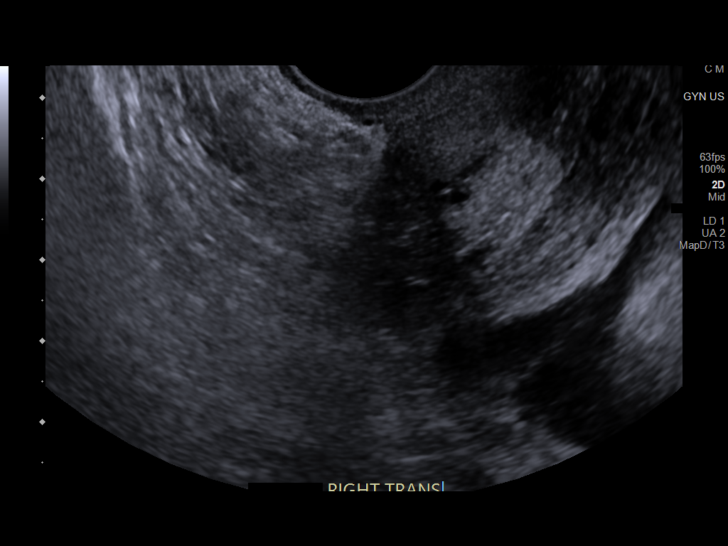
[im 74/89]
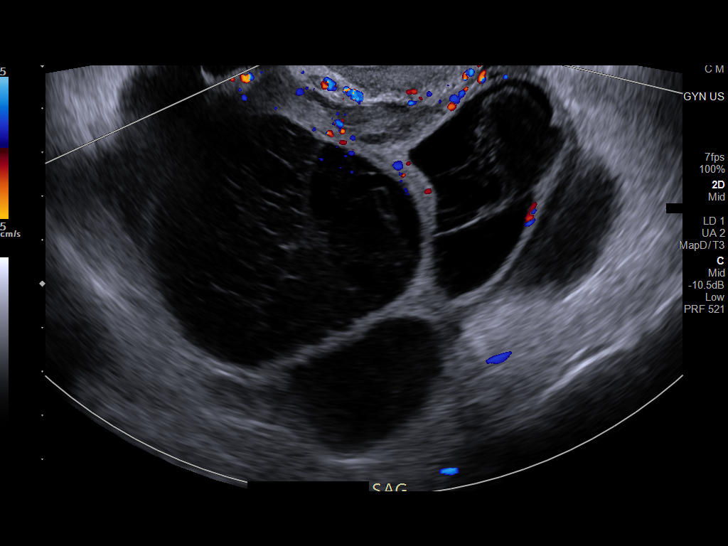
[im 81/89]
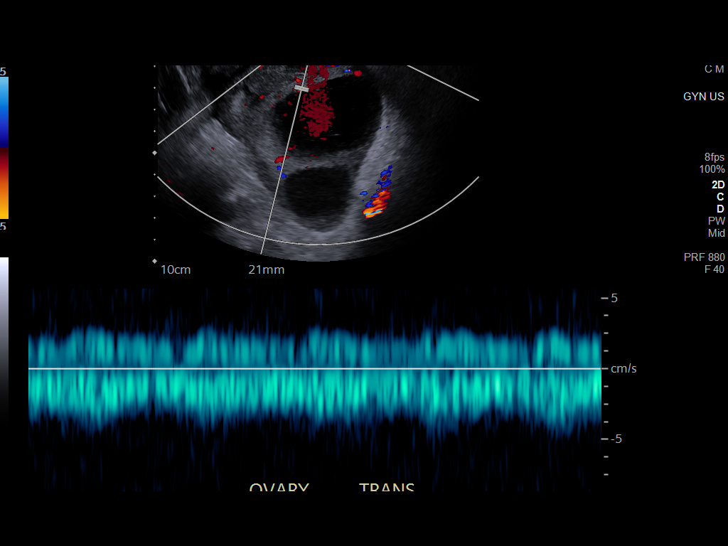
[im 89/89]
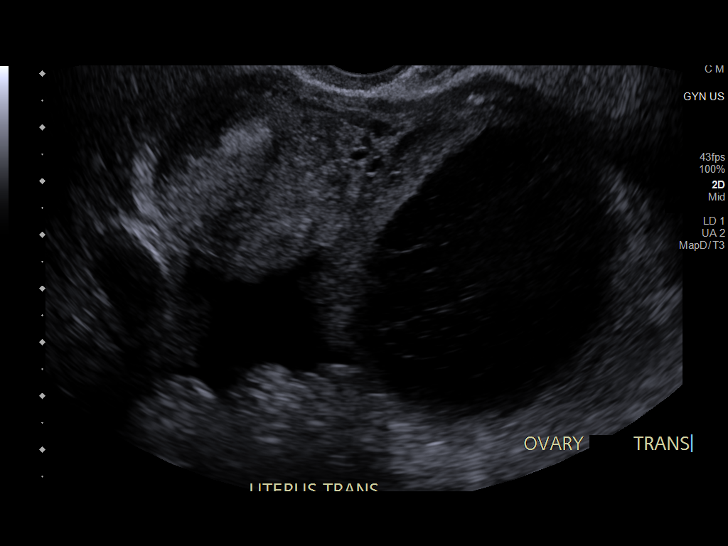

[13 of 25 positions shown; findings below may reference images not displayed]

FINDINGS: Uterus

Measurements: 7.7 x 3.3 x 4.5 cm = volume: 59.1 mL. No fibroids or
other mass visualized.

Endometrium

Thickness: 11.4 mm.  No focal abnormality visualized.

Right ovary

Not visualized, reportedly surgically absent. No right-sided adnexal
mass.

Left ovary

Measurements: 10.6 x 8.1 x 7.9 cm = volume: 355.6 mL. Enlarged
hypoechoic cystic mass with internal lace-like architecture
measuring 6.5 x 5.9 x 6.4 cm seen arising from the left ovary (image
77). An additional similar hypoechoic complex lesion measuring 5.8 x
3.0 x 5.5 cm is seen as well (image 79). These are favored to be
separate lesions, although a single large complex multiloculated
cystic mass is difficult to exclude. Intervening curvilinear band of
soft tissue favored to reflect ovarian parenchyma, although a
thickened internal septation with vascularity is not excluded (image
72). Few additional smaller cystic components noted as well. No
visible solid nodularity.

Pulsed Doppler evaluation of the left ovary demonstrates normal
low-resistance arterial and venous waveforms.

Other findings

Moderate volume free fluid within the pelvis.
IMPRESSION: 1. Multiple enlarged complex cystic masses arising from the left
ovary, largest of which measures 6.5 cm as above. Findings are
favored to reflect large hemorrhagic cysts. Possible single
multiloculated complex cystic mass or cystic ovarian neoplasm is not
excluded. Short interval follow-up ultrasound in 6-12 weeks
recommended for further evaluation. Additionally, further assessment
with dedicated pelvic MRI, with and without contrast, may be helpful
for further evaluation as warranted.
2. Associated moderate volume free fluid within the pelvis.
3. No evidence for left ovarian torsion.
4. Prior right oophorectomy.
5. Normal sonographic appearance of the uterus and endometrium.
